# Patient Record
Sex: Female | Born: 1965 | Race: Black or African American | Hispanic: No | Marital: Single | Smoking: Never smoker
Health system: Southern US, Community
[De-identification: ages and names within clinical notes are randomized; demographics above are authoritative.]

## PROBLEM LIST (undated history)

## (undated) DIAGNOSIS — I10 Essential (primary) hypertension: Secondary | ICD-10-CM

---

## 2007-10-04 ENCOUNTER — Emergency Department (HOSPITAL_COMMUNITY): Admission: EM | Admit: 2007-10-04 | Discharge: 2007-10-04 | Payer: Self-pay | Admitting: Emergency Medicine

## 2008-06-22 ENCOUNTER — Emergency Department (HOSPITAL_COMMUNITY): Admission: EM | Admit: 2008-06-22 | Discharge: 2008-06-22 | Payer: Self-pay | Admitting: Emergency Medicine

## 2011-07-23 ENCOUNTER — Inpatient Hospital Stay (INDEPENDENT_AMBULATORY_CARE_PROVIDER_SITE_OTHER)
Admission: RE | Admit: 2011-07-23 | Discharge: 2011-07-23 | Disposition: A | Discharge status: Home / Self Care | Payer: BC Managed Care – PPO | Source: Ambulatory Visit | Attending: Emergency Medicine | Admitting: Emergency Medicine

## 2011-07-23 ENCOUNTER — Encounter: Payer: Self-pay | Admitting: Emergency Medicine

## 2011-07-23 DIAGNOSIS — H1130 Conjunctival hemorrhage, unspecified eye: Secondary | ICD-10-CM | POA: Insufficient documentation

## 2011-08-16 LAB — CBC
HCT: 41.1
Hemoglobin: 14
MCHC: 34
MCV: 84
Platelets: 382
RBC: 4.9
RDW: 13.1
WBC: 11.5 — ABNORMAL HIGH

## 2011-08-16 LAB — BASIC METABOLIC PANEL
BUN: 11
CO2: 29
Calcium: 8.8
Chloride: 104
Creatinine, Ser: 0.92
GFR calc Af Amer: >60
GFR calc non Af Amer: >60
Glucose, Bld: 83
Potassium: 3.1 — ABNORMAL LOW
Sodium: 139

## 2011-08-16 LAB — POCT CARDIAC MARKERS
CKMB, poc: 1.3
Myoglobin, poc: 69.2
Troponin i, poc: <0.05

## 2011-10-15 ENCOUNTER — Encounter: Payer: Self-pay | Admitting: Family Medicine

## 2011-10-15 ENCOUNTER — Ambulatory Visit (INDEPENDENT_AMBULATORY_CARE_PROVIDER_SITE_OTHER): Payer: BC Managed Care – PPO | Admitting: Family Medicine

## 2011-10-15 VITALS — BP 148/86 | HR 77 | Ht 65.0 in | Wt 216.0 lb

## 2011-10-15 DIAGNOSIS — R030 Elevated blood-pressure reading, without diagnosis of hypertension: Secondary | ICD-10-CM

## 2011-10-15 DIAGNOSIS — M25469 Effusion, unspecified knee: Secondary | ICD-10-CM

## 2011-10-15 DIAGNOSIS — E669 Obesity, unspecified: Secondary | ICD-10-CM

## 2011-10-15 MED ORDER — PHENTERMINE HCL 37.5 MG PO CAPS: 37.5000 mg | ORAL_CAPSULE | ORAL | Status: DC

## 2011-10-15 NOTE — Patient Instructions (Signed)
Knee Problems, Questions and Answers Knee problems are common in young people and adults. This document contains general information about several knee problems. It includes:  Descriptions of the different parts of the knee.   Diagram of the different parts of the knee.  Individual sections describe specific types of knee injuries and their:   Symptoms.   Diagnosis.   Treatment.  Information on how to prevent these problems is also provided. WHAT DO THE KNEES DO? HOW DO THEY WORK? The knees provide stable support for the body. Knees allow the legs to bend and straighten. Flexibility and stability are needed for standing and for motions like:  Walking.   Jumping.   Running.   Turning.   Crouching.  Supporting and moving parts help the knees do their job, these parts include:  Bones.   Cartilage.   Muscles.   Ligaments.   Tendons.  Any of these parts can be involved in knee pain or a knee not working right (dysfunction). WHAT CAUSES KNEE PROBLEMS? There are two general kinds of knee problems: mechanical and inflammatory. Mechanical Knee Problems are problems that result from:  Injury, such as a direct blow or sudden movements that strain the knee beyond its normal range of movement.   Overuse, repetitive motions that produce partial fiber failure in tendon or ligaments.   Osteoarthritis in the knee, result from wear and tear on its parts.  Inflammatory Knee Problems are inflammation that occurs in certain rheumatic diseases, such as:   Rheumatoid arthritis.   Systemic lupus erythematosus.  JOINT BASICS  The point at which two or more bones are connected is called a joint.   In all joints, the bones are kept from grinding against each other by the tissue lining the ends of the bones called cartilage.   Bones are joined to bones by strong, elastic bands of tissue called ligaments.   Tendons are tough cords of tissue that connect muscle to bone.   Muscles work  in opposing pairs to bend and straighten joints. While muscles are not technically part of a joint, they are important because strong muscles help support and protect joints.  WHAT ARE THE PARTS OF THE KNEE? Like any joint, the knee is composed of bones and cartilage, ligaments, tendons, and muscles. BONES AND CARTILAGE The knee joint is the junction of four bones:   The thigh bone or upper leg bone (femur).   The shin bone or larger bone of the lower leg (tibia).   The small bone on the outside of the knee where ligaments attach (fibula).   The knee cap (patella). The patella is 2 to 3 inches wide and 3 to 4 inches long. It sits over the other bones at the front of the knee joint and slides when the leg moves. It protects the knee and gives leverage to muscles.  The ends of the bones in the knee joint are covered with articular cartilage, a tough, elastic material that helps absorb shock and allows the knee joint to move smoothly. Separating the bones of the knee are pads of connective tissue which are called meniscus. The plural is menisci. The menisci are divided into two crescent-shaped discs positioned between the tibia and femur on the outer and inner sides of each knee. The two menisci in each knee act as shock absorbers, cushioning the lower part of the leg from the weight of the rest of the body as well as enhancing stability. MUSCLES There are two groups of  muscles at the knee.  The quadriceps muscle are four muscles on the front of the thigh that work to straighten the leg from a bent position.   The hamstring muscles, which bend the leg at the knee, run along the back of the thigh from the hip to just below the knee.  Keeping these muscles strong with exercises such as walking up stairs or riding a stationary bicycle helps support and protect the knee. TENDONS AND LIGAMENTS  The quadriceps tendon connects the quadriceps muscle to the patella and provides the power to extend the  leg. The patella is a bone within this tendon. Four ligaments connect the femur and tibia and give the joint strength and stability:   The medial collateral ligament (MCL) provides stability to the inner (medial) part of the knee.   The lateral collateral ligament (LCL) provides stability to the outer (lateral) part of the knee.   The anterior cruciate ligament (ACL), in the center of the knee, limits rotation and the forward movement of the tibia.   The posterior cruciate ligament (PCL), also in the center of the knee, limits backward movement of the tibia.   Other ligaments are part of the knee capsule. This is the protective, fiber-like structure that wraps around the knee joint. Inside the capsule, the joint is lined with a thin, soft tissue called synovium. This tissue produces the fluid (synovial fluid) which lubricates the joint.  HOW ARE KNEE PROBLEMS DIAGNOSED? Caregivers use several methods to diagnose knee problems:  Medical history--The patient tells the caregiver details about:   Symptoms.   Injuries.   Medical conditions.   Physical examination-- To help the caregiver understand how the knee is working, the patient may be asked to stand, walk or squat. The caregiver, to discover the limits of movement and the location of pain in the knee, may:   Bend the knee.   Straighten the knee.   Rotate (turn) turn the knee.   Press on the knee to feel for injury.   Diagnostic tests--The caregiver uses one or more stress tests to determine the nature of a knee problem.   X-ray (radiography)--An x-ray beam is passed through the knee to produce a two-dimensional picture of the bones.   Computerized axial tomography (CAT) scan--X-rays are passed through the knee at different angles, detected by a scanner, and analyzed by a computer. This produces a series of clear cross-sectional images ("slices") of the knee tissues on a computer screen. CAT scan images show details of bone  structure, show soft tissues such as ligaments or muscles to a limited degree, can give a three-dimensional view of the knee.   Bone scan (radionuclide scanning)--A very small amount of radioactive material is injected into the patient's bloodstream and detected by a scanner. This test detects blood flow to the bone and cell activity within the bone and can show abnormalities. This may help the caregiver understand what is wrong.   Magnetic resonance imaging (MRI)--Energy from a powerful magnet (rather than x-rays) stimulates knee tissue to produce signals. These signals are detected by a scanner and analyzed by a computer. Like a CAT scan, a computer is used to produce three-dimensional views of the knee during MRI. The MRI provides precise details of ligament, tendon and cartilage structure.   Arthroscopy--The surgeon manipulates a small, lighted optic tube (arthroscope) that has been inserted into the joint through a small incision in the knee. Images of the inside of the knee joint are projected onto a  television screen. While the arthroscope is inside the knee joint, removal of loose pieces of bone or cartilage, or the repair of torn ligaments and menisci can be preformed.   Biopsy--The caregiver removes tissue to examine under a microscope.   Aspiration of fluid from the knee--The laboratory will analyze the fluid for cell count, presence of crystals that produce inflammation (as in gout where Uric Acid crystals are the cause of the inflammation) and check for infection.  WHAT IS ARTHRITIS OF THE KNEE?   Arthritis of the knee is most often osteoarthritis. In this disease, the cartilage in the joint gradually wears away. It may be caused by excess stress on the joint from:   Trauma.   Deformity.   Repeated injury.   Excess weight.   It most often affects middle-aged and older people. A young person who develops osteoarthritis may have an inherited form of the disease or may have  experienced continuous irritation from an unrepaired knee injury or other injury.   In rheumatoid arthritis, which can also affect the knees, the joint becomes inflamed and cartilage may be destroyed. Rheumatoid arthritis often affects people at an earlier age than osteoarthritis and often involves multiple joints.   Arthritis can also affect supporting structures such as muscles, tendons, and ligaments.  WHAT ARE SIGNS OF ARTHRITIS OF THE KNEE?  Someone who has arthritis of the knee may experience:   Pain.   Swelling/ fluid on the knee.   A decrease in knee motion.   A common symptom is morning stiffness. This generally improves as the person moves around.   Sometimes the joint locks or clicks. These signs may occur in other knee disorders as well.   The caregiver may confirm the diagnosis by:   Performing a physical examination.   Examining x-rays, which typically show a loss of joint space.   Blood tests may be helpful for diagnosing rheumatoid arthritis, but other tests may be needed.   Analyzing fluid from the knee joint may be helpful in diagnosing some kinds of arthritis.   The caregiver may use arthroscopy to directly see damage to cartilage, tendons, and ligaments and to confirm a diagnosis. Arthroscopy is usually done only if a repair procedure is to be performed.  WHAT IS TREATMENT FOR ARTHRITIS OF THE KNEE?  Most often osteoarthritis of the knee is treated with pain-reducing medicines, such as:   Nonsteroidal anti-inflammatory drugs (NSAID's)   Exercises to restore joint movement and strengthen the knee.   Losing excess weight can also help people with osteoarthritis.   Rheumatoid arthritis of the knee may require physical therapy and more powerful medicines. In people with severe arthritis of the knee, a seriously damaged joint may need to be replaced with an artificial one.  WHAT IS CHONDROMALACIA?  Chondromalacia refers to softening of the articular cartilage  of the knee cap. This disorder occurs most often in young adults. Instead of gliding smoothly across the lower end of the thigh bone, the knee cap rubs against it, thereby roughening the cartilage underneath the knee cap. The damage may range from a slightly abnormal surface of the cartilage to a surface that has been worn away to the bone. It can be caused by:   Injury.   Overuse.   Misalignment of the patellar tendon.   Muscle weakness (generally the quadriceps).   Chondromalacia related to injury occurs when a blow to the knee cap tears off either a small piece of cartilage or a large fragment containing  a piece of bone.  WHAT ARE SYMPTOMS OF CHONDROMALACIA?  The most frequent symptom is a dull pain around or under the knee cap. This pain worsens when walking down stairs, or hills or sitting with the knee bent for long periods of time.   A person may also feel pain when climbing stairs or when the knee bears weight as it straightens.   The disorder is common in:   Runners.   Skiers.   Cyclists.   Soccer players.   A patient's description of symptoms, the physical exam, and a follow-up x-ray usually help the caregiver make a diagnosis.   Although arthroscopy can confirm the diagnosis. It is not used unless the condition requires extensive treatment.  WHAT IS TREATMENT FOR CHONDROMALACIA?  Many caregivers recommend that patients with chondromalacia perform low-impact exercises. The knee must not bend more than 90 degrees. This includes:   Swimming.   Riding a stationary bicycle.   Using a cross-country ski machine.   Electrical stimulation may also be used to strengthen the muscles.   If these treatments do not improve the condition, the caregiver may perform arthroscopic. Goals of this surgery include smoothing the surface of the cartilage and "washing out" the cartilage fragments that cause the joint to catch during bending and straightening.   In more severe cases,  surgery may be necessary to:   Correct the alignment of the knee cap.   Decrease the pressure on the undersurface of the patella.   Relieve friction with the cartilage.   Reposition parts that are out of alignment.  WHAT CAUSES INJURIES TO THE MENISCUS? The meniscus is easily injured by the force of rotating the knee while bearing weight. A partial or total tear may occur when a person quickly twists or rotates the upper leg while the foot stays still. For example, when dribbling a basketball around an opponent or turning to hit a tennis ball. If the tear is tiny, the meniscus stays connected to the front and back of the knee. If the tear is large, the meniscus may be left in an abnormally mobile position which produces instability. The seriousness of a tear depends on its location and extent. WHAT ARE SYMPTOMS OF INJURIES TO THE MENISCUS?  Pain, particularly when the knee is straightened.   If the pain is mild, the patient may continue with normal activity.   Severe pain may occur if a fragment of the meniscus catches between the femur and the tibia.   Swelling may occur soon after injury if blood vessels are disrupted. Swelling may occur several hours later if the joint fills with fluid produced by the joint lining (synovium) as a result of inflammation. If the synovium is injured, it may become inflamed and produce fluid. This makes the knee swell.   Sometimes, an injury that occurred in the past but was not treated becomes painful months or years later.   After any injury, the knee may click, lock, feel weak, or give way without warning.   Although symptoms of meniscal injury may disappear on their own (particularly with a stable meniscal tear), they frequently persist or return and require treatment.  HOW IS INJURY TO THE MENISCUS DIAGNOSED?  The caregiver will listen to the patient's description of the pain and swelling. The caregiver will perform a physical examination and take  x-rays of the knee. The examination may include a test in which the caregiver bends the leg, and then rotates the leg outward and inward while extending  it. Pain along the joint line or an audible click suggests a meniscal tear.   An MRI may be done.   Occasionally, the caregiver may use arthroscopy without obtaining the MRI to diagnose and treat a meniscal tear.  WHAT IS TREATMENT FOR INJURY TO THE MENISCUS?  The caregiver may recommend a muscle-strengthening program if:   The tear is minor.   The pain and symptoms are improving.   Exercises for meniscal problems are best started with guidance from a caregiver and physical therapist or athletic trainer. The therapist will make sure that the patient does the exercises properly and without risking new or repeat injury. The following exercises after injury to the meniscus are designed to build up the quadriceps and hamstring muscles and increase flexibility and strength.   Warming up the joint by riding a stationary bicycle, then straightening and raising the leg (but not straightening it too much).   Extending the leg while sitting (a weight may be worn on the ankle for this exercise).   Raising the leg while lying on the stomach.   Exercising in a pool (walking as fast as possible in chest-deep water, performing small flutter kicks while holding onto the side of the pool, and raising each leg to 90 in chest-deep water while pressing the back against the side of the pool).   If the tear is more extensive, the caregiver may perform arthroscopic with or without open surgery to see the extent of injury and to repair the tear. The caregiver can sew the meniscus back in place if the patient is relatively young, if the injury is in an area with a good blood supply, and if the ligaments are intact. Most young athletes are able to return to active sports after meniscus repair.   If the patient is elderly or the tear is in an area with a poor blood  supply, the caregiver may trim a small portion of the meniscus to even the surface. In rare cases, the caregiver removes the entire meniscus. Osteoarthritis is more likely to develop in the knee if the entire meniscus is removed.   Recovery after surgical repair takes several weeks to months. Activity after surgery is slightly more restricted than when the meniscus is partially removed. However, putting weight on the joint actually helps recovery. Regardless of the form of surgery, rehabilitation usually includes:   Walking.   Bending the legs.   Doing exercises that stretch and build up leg muscles.   The best results of treatment for meniscal injury are obtained in people who:   Do not have articular cartilage changes.   Have an intact ACL.  LIGAMENT INJURIES WHAT ARE THE CAUSES OF ANTERIOR AND POSTERIOR CRUCIATE LIGAMENT INJURIES?  Injury to the cruciate ligaments is sometimes referred to as a "sprain".   The ACL is most often stretched or torn (or both) by a sudden twisting or pushing the ACL beyond its normal range. For example, when the feet are planted one way and the knee rotates in the opposite direction.   The PCL is most often injured by a direct impact, such as in an automobile accident or football tackle.  WHAT ARE SYMPTOMS?  Injury to a cruciate ligament may not cause pain. Symptoms may include:   A popping sound   Buckling when trying to stand on the leg.   The caregiver will perform several physical exam tests. These tests are to see whether the parts of the knee stay in proper position  when pressure is applied in different directions.   A thorough examination is essential. An MRI is very accurate in detecting a complete tear. Arthroscopy may be the only reliable means of detecting a partial one.  TREATMENT  For an incomplete tear, the caregiver may recommend that the patient begin an exercise program to strengthen surrounding muscles.   The caregiver may also  prescribe a brace to protect the knee during activity.   For a completely torn ACL in an active athlete and motivated person, the caregiver is likely to recommend surgery. The surgeon may reconstruct the torn ligament by using:   A piece (graft) of healthy ligament from the patient (autograft)   A piece of ligament from a tissue bank (allograft). One of the most important elements in a patient's successful recovery after cruciate ligament surgery is a 4- to 61-month exercise program. This program may involve using special exercise equipment at a rehabilitation or sports center. Successful surgery special exercises will allow the patient to return to a normal, active lifestyle.  MEDIAL AND LATERAL COLLATERAL LIGAMENT INJURIES WHAT IS THE MOST COMMON CAUSE OF MEDIAL AND LATERAL COLLATERAL LIGAMENT INJURIES? The MCL is more commonly injured than the LCL. The cause is most often a blow to the outer side of the knee. This injury stretches and tears the ligament on the inner side of the knee. Such blows frequently occur in contact sports like football or hockey. SYMPTOMS AND DIAGNOSIS  When injury to the MCL occurs, a person may feel a pop and the knee may buckle sideways.   Pain and swelling are common.   A thorough exam is needed to determine the kind and extent of the injury.   To diagnose a collateral ligament injury, the caregiver exerts pressure on the side of the knee to determine the degree of pain and the looseness of the joint.   An MRI is helpful in diagnosing injuries to these ligaments.  TREATMENT  Most sprains of the collateral ligaments will heal if the patient follows a prescribed exercise program.   In addition to exercise, the caregiver may recommend ice packs to reduce pain and swelling and a small sleeve-type brace to protect and stabilize the knee.   A sprain may take 4 to 6 weeks to heal.   A patient with a severely sprained or torn collateral ligament may also have a torn  ACL. This usually requires surgical repair.  TENDON INJURIES AND DISORDERS WHAT CAUSES TENDINITIS AND RUPTURED TENDONS?  Knee tendon injuries range from tendinitis to a torn (ruptured) tendon.   If a person overuses a tendon during certain activities such as dancing, cycling, or running, the tendon stretches like a worn-out rubber band and becomes inflamed.   Also, trying to break a fall may cause the quadriceps muscles to contract and tear the quadriceps tendon above the patella or the patellar tendon below the patella. This type of injury is most likely to happen in older people.   Tendinitis of the patellar tendon is sometimes called jumper's knee because in sports that require jumping, such as basketball or volleyball, the muscle contraction and force of hitting the ground after a jump strain the tendon.   After repeated stress, the tendon may become inflamed or tear.  SYMPTOMS AND DIAGNOSIS  People with tendinitis often have tenderness at the point where the patellar tendon meets the bone. In addition, they may feel pain during running, fast walking, or jumping.   A complete rupture of the quadriceps  or patellar tendon is painful. It also makes it difficult for a person to bend, extend, or lift the leg against gravity.   If there is not much swelling, the caregiver may be able to feel a defect in the tendon near the tear during a physical examination.   An x-ray will show that the patella is lower than normal in a quadriceps tendon tear and higher than normal in a patellar tendon tear. The caregiver may use an MRI to confirm a partial or total tear.  TREATMENT  Initially, the caregiver may ask a patient with tendinitis to rest, elevate, and apply ice to the knee and to take medicines to relieve pain and decrease inflammation and swelling.   If the quadriceps or patellar tendon is completely ruptured, a surgeon will reattach the ends. After surgery, the patient will wear a cast or brace  for 3 to 6 weeks and use crutches.   For a partial tear, the caregiver might apply a cast or an extension knee brace without performing surgery.   Rehabilitating a partial or complete tear of a tendon requires an exercise program that is similar to but less forceful than that used for ligament injuries. The goals of exercise are to restore the ability to bend and straighten the knee and to strengthen the leg to prevent repeat injury. A rehabilitation program may last 4 to 6 months. A patient can return to many activities before then.  OSGOOD-SCHLATTER DISEASE WHAT CAUSES OSGOOD-SCHLATTER DISEASE?  Osgood-Schlatter disease is caused by repetitive stress or tension on part of the growth area of the upper tibia (the apophysis). Symptoms included inflammation of the patellar tendon and surrounding soft tissues at the point where the tendon attaches to the tibia.   The disease may also be associated with an injury in which the tendon is stretched so much that it tears away from the tibia and takes a fragment of bone with it.   The disease generally affects active young people. Particularly boys between the ages of 89 and 22, who play games or sports that include frequent running and jumping and who have open growth plates.  SYMPTOMS AND DIAGNOSIS  People with this disease experience pain just below the knee joint. This pain usually worsens with activity and is relieved by rest.   The bony bump that is particularly painful when pressed may increase in size at the upper edge of the tibia (below the knee cap).   Usually motion of the knee is not affected.   Pain may last a few months and may come back with periods of high activity until the child's growth is completed.   Osgood-Schlatter disease is most often diagnosed by the symptoms and the physical exam. An x-ray may be normal, or show an injury. An x-ray, more typically, will show that the growth area is fragmented.  TREATMENT  Usually, the  disease goes away without aggressive treatment.   Applying ice to the knee when pain begins helps relieve inflammation. Applying ice is sometimes used along with stretching and strengthening exercises.   The caregiver may advise the patient to limit participation in vigorous sports. Children who wish to continue less stressful sports activities may need to wear knee pads for protection and apply ice to the knee after activity. If there is a great deal of pain, sports activities may be limited until discomfort becomes tolerable.  ILIOTIBIAL BAND SYNDROME WHAT CAUSES ILIOTIBIAL BAND SYNDROME? This is an overuse condition in which inflammation results when  a band of a tendon rubs over the outer bone of the knee. Although iliotibial band syndrome may be caused by direct injury to the knee, it is most often caused by the stress of long-term overuse. SYMPTOMS AND DIAGNOSIS  A person with this syndrome feels an ache or burning sensation at the outside of the knee during activity. Pain may be localized at the outside of the knee or radiate up the side of the thigh.   A person may also feel a snap when the knee is bent and then straightened.   Swelling may be absent and knee motion is normal.   The diagnosis of this disorder is typically based on the symptoms. Symptoms include pain at the outer side of the knee. Other problems with similar symptoms must also be excluded.  TREATMENT  Usually, the problem disappears if the person reduces activity and performs stretching exercises followed by muscle-strengthening exercises.   In rare cases when the syndrome does not disappear, surgery may be necessary to split the tendon so it is not stretched too tightly over the bone.  OTHER KNEE INJURIES WHAT IS OSTEOCHONDRITIS DISSECANS?  Osteochondritis dissecans results from a loss of the blood supply to an area of bone at the joint surface and usually involves the knee. The affected bone and its covering of  cartilage gradually loosen and cause pain.   This problem usually arises in an active adolescent or young adult. It may be due to a slight blockage of a small artery or to an unrecognized injury or tiny fracture that damages the overlying cartilage.   Lack of a blood supply can cause bone to break down (avascular necrosis).   The involvement of several joints or the appearance of the condition in several family members may indicate that the disorder is inherited.   A person with this condition may eventually develop osteoarthritis.  SYMPTOMS AND DIAGNOSIS  If normal healing does not occur, cartilage separates from the diseased bone and a fragment breaks loose into the knee joint. This causes weakness, sharp pain, and locking of the joint.   An x-ray, MRI, or arthroscopy can determine the condition of the cartilage and can be used to diagnose osteochondritis dissecans.  TREATMENT  If cartilage fragments have not broken loose, a surgeon may fix the cartilage and underlying bone in place with pins or screws. These pins or screws are sunk into the cartilage to stimulate a new blood supply.   If fragments are loose, the surgeon may scrape down the cavity to reach fresh bone and add a bone graft and fix the fragments in position. Fragments that cannot be mended are removed, and the cavity is drilled or scraped to stimulate new cartilage growth.   Research is being done to assess the use of cartilage cell implants and other tissue transplants to treat this disorder.  WHAT IS PLICA SYNDROME?  Plica syndrome occurs when plicae (bands of synovial tissue) are irritated by overuse or injury.   Synovial plicae are the remains of tissue pouches found in the early stages of fetal development. As the fetus develops, these pouches normally combine to form one large synovial cavity. If this process is incomplete, plicae remain as folds or bands of synovial tissue within the knee.   Injury, chronic overuse,  or inflammatory conditions are associated with this syndrome.  SYMPTOMS AND DIAGNOSIS  People with this syndrome are likely to experience pain and swelling, a clicking sensation, and locking and weakness of the knee.  Because the symptoms are similar to those of some other knee problems, plica syndrome is often misdiagnosed. Diagnosis usually depends on excluding other conditions that cause similar symptoms.  TREATMENT  The goal of treatment is to reduce inflammation of the synovium and thickening of the plicae.   The caregiver usually prescribes medicine to reduce inflammation.   The patient is also advised to reduce activity, apply ice and an elastic bandage to the knee, and do strengthening exercises.   A cortisone injection into the plica folds helps about half of those treated.   If treatment fails to relieve symptoms within 3 months, the caregiver may recommend arthroscopic or open surgery to remove the plicae.  WHAT KINDS OF CAREGIVERS TREAT KNEE PROBLEMS?  Extensive injuries and diseases of the knees are usually treated by an orthopedic surgeon, a doctor who has been trained in the nonsurgical and surgical treatment of bones, joints, and soft tissues such as ligaments, tendons, and muscles.   Patients seeking nonsurgical treatment of arthritis of the knee may also consult a rheumatologist. This is a caregiver specializing in the diagnosis and treatment of arthritis and related disorders.  HOW CAN PEOPLE PREVENT KNEE PROBLEMS? Some knee problems cannot be foreseen or prevented. However, a person can prevent many knee problems by following these suggestions:  Before exercising or playing sports, warm up by walking or riding a stationary bicycle, and then do stretches. Stretching the muscles in the front of the thigh (quadriceps) and back of the thigh (hamstrings) reduces tension on the tendons. Stretching also relieves pressure on the knee during activity.   Strengthen the leg  muscles by doing specific exercises (for example, by walking up stairs or hills, or by riding a stationary bicycle). A supervised workout with weights is another way to strengthen the leg muscles that support the knee.   Avoid sudden changes in the intensity of exercise. Increase the force or duration of activity gradually.   Wear shoes that both fit properly and are in good condition. Good shoes will help maintain balance and leg alignment when walking or running. Knee problems can be caused by flat feet or feet that roll inward. People can often reduce some of these problems by wearing special shoe inserts (orthotics).   Maintain a healthy weight to reduce stress on the knee. Obesity increases the risk of degenerative (wearing) conditions such as osteoarthritis of the knee.  WHAT TYPES OF EXERCISE ARE MOST SUITABLE FOR SOMEONE WITH KNEE PROBLEMS? Three types of exercise are best for people with arthritis:  Range-of-motion exercises help maintain normal joint movement and relieve stiffness. This type of exercise helps maintain or increase flexibility.   Strengthening exercises help maintain or increase muscle strength. Strong muscles help support and protect joints affected by arthritis.   Aerobic or endurance exercises improve function of the heart and circulation and help control weight. Weight control can be important to people who have arthritis because extra weight puts pressure on many joints. Some studies show that aerobic exercise can reduce inflammation in some joints.  WHERE CAN PEOPLE FIND MORE INFORMATION ABOUT KNEE PROBLEMS? [employer] Arthritis and Musculoskeletal and Skin: www.niams.SelfMillionaire.nl American Academy of Orthopedic Surgeons: www.aaos.org [employer] of Rheumatology: www.rheumatology.org American Physical Therapy Association: www.apta.org Arthritis Foundation: www.arthritis.org Document Released: 11/07/2003 Document Revised: 07/17/2011 Document Reviewed:  02/22/2009 [facility] Patient Information 2012 [place], [place].Obesity Obesity is defined as having a body mass index (BMI) of 30 or more. To calculate your BMI divide your weight in pounds by your  height in inches squared and multiply that product by 703. Major illnesses resulting from long-term obesity include:  Stroke.   Heart disease.   Diabetes.   Many cancers.   Arthritis.  Obesity also complicates recovery from many other medical problems.  CAUSES   A history of obesity in your parents.   Thyroid hormone imbalance.   Environmental factors such as excess calorie intake and physical inactivity.  TREATMENT  A healthy weight loss program includes:  A calorie restricted diet based on individual calorie needs.   Increased physical activity (exercise).  An exercise program is just as important as the right low-calorie diet.  Weight-loss medicines should be used only under the supervision of your physician. These medicines help, but only if they are used with diet and exercise programs. Medicines can have side effects including nervousness, nausea, abdominal pain, diarrhea, headache, drowsiness, and depression.  An unhealthy weight loss program includes:  Fasting.   Fad diets.   Supplements and drugs.  These choices do not succeed in long-term weight control.  HOME CARE INSTRUCTIONS  To help you make the needed dietary changes:   Exercise and perform physical activity as directed by your caregiver.   Keep a daily record of everything you eat. There are many free websites to help you with this. It may be helpful to measure your foods so you can determine if you are eating the correct portion sizes.   Use low-calorie cookbooks or take special cooking classes.   Avoid alcohol. Drink more water and drinks with no calories.   Take vitamins and supplements only as recommended by your caregiver.   Weight loss support groups, Registered Dieticians, counselors, and stress  reduction education can also be very helpful.  Document Released: 12/12/2004 Document Revised: 07/17/2011 Document Reviewed: 10/11/2007 [facility] Patient Information 2012 [place], [place].

## 2011-10-15 NOTE — Progress Notes (Signed)
Subjective:    Patient ID: Tammy Reed, female    DOB: 22-Jan-1966, 45 y.o.   MRN: [id number]  HPI #1 Patient is here because of obesity she reports that when she was taking phentermine she lost a lot of weight and lost 30 pounds over several months/year she stopped taking the phentermine one on a meat free diet or she's a meat free fast and reported eating as much asshe wanted" and she gained about 20 pounds back. She was go back on phentermine on questioning she reports not exercising a regular basis and she is to contact me. Stressed to her that if we do start the phentermine she definitely is going to need to exercise. #2 swelling of the right knee she reports going to take a fluid pill she has some swelling of the right knee this is a room for a couple of months she felt that because she's gained the weight back that the right knee got  fat that the left he didn't have any changes. #3 elevated blood pressure blood pressure is elevated she denies history of hypertension will be continued on  Review of Systems  Constitutional: Positive for appetite change and fatigue. Negative for activity change.  Musculoskeletal: Positive for myalgias, joint swelling and arthralgias. Negative for gait problem.  Psychiatric/Behavioral: Negative for self-injury.  All other systems reviewed and are negative.      BP 148/86  Pulse 77  Ht 5\' 5"  (1.651 m)  Wt 216 lb (97.977 kg)  BMI 35.94 kg/m2  SpO2 97%  Objective:   Physical Exam  Vitals reviewed. Constitutional: She is oriented to person, place, and time. She appears well-developed and well-nourished.  HENT:  Head: Normocephalic.  Neck: Normal range of motion. Neck supple.  Cardiovascular: Normal rate, regular rhythm and normal heart sounds.   Pulmonary/Chest: Effort normal and breath sounds normal. No respiratory distress.  Musculoskeletal: She exhibits tenderness.       Right knee: She exhibits swelling and deformity. She exhibits normal range of  motion, no effusion, no erythema, no LCL laxity and normal patellar mobility. tenderness found. Lateral joint line tenderness noted.       Over the R inferior and lateral knee there is a swellling that seems to be more like a bursa No generalized edema was noted  Neurological: She is alert and oriented to person, place, and time. She has normal reflexes.  Skin: Skin is warm.  Psychiatric: She has a normal mood and affect. Her behavior is normal.          Assessment & Plan:  #1 obesity we'll place her back on phentermine with extended relation to she's on lose weight each month she is going to exercise her medical basis initially come in monthly to verify the phentermine is working as not to stop it in the next month she comes in for physical if she's not had actually have a complete physical and quite to her her last Pap smear was been a while. Explained to the we will also lab work and EKG when she's here to use the phentermine long-term #2 we'll follow up elevated blood pressure when she returns #3 right knee swelling explained to her that this could be a bursa assist not inflamed a cousin. Be truly a joint effusion this is a well little local localized. Can also be a fat pad I asked her to watch the swelling and if it starts growing will need to send her to an  orthopedic for possible aspiration  with further evaluation. He offers x-ray of the right knee she says it has been done before and she declines

## 2011-10-21 NOTE — Progress Notes (Signed)
Summary: LT EYE PAIN AND RED...WSE rm 2   Vital Signs:  Patient Profile:   45 Years Old Female CC:      LT eye redness x today Weight:      204.50 pounds O2 Sat:      98 % O2 treatment:    Room Air Temp:     98.8 degrees F oral Pulse rate:   75 / minute Resp:     75 per minute BP sitting:   172 / 100  (left arm) Cuff size:   regular  Vitals Entered By: Clemens Catholic LPN (July 23, 2011 12:18 PM)                  Updated Prior Medication List: No Medications Current Allergies: No known allergies History of Present Illness Chief Complaint: LT eye redness x today History of Present Illness: L eye redness since today.  No pain, blurry vision, itching, irritation.  She doesn't recall any trauma.  No URI symptoms, F/C/N/V.  REVIEW OF SYSTEMS Constitutional Symptoms      Denies fever, chills, night sweats, weight loss, weight gain, and fatigue.  Eyes       Complains of eye pain.      Denies change in vision, eye discharge, glasses, contact lenses, and eye surgery. Ear/Nose/Throat/Mouth       Denies hearing loss/aids, change in hearing, ear pain, ear discharge, dizziness, frequent runny nose, frequent nose bleeds, sinus problems, sore throat, hoarseness, and tooth pain or bleeding.  Respiratory       Denies dry cough, productive cough, wheezing, shortness of breath, asthma, bronchitis, and emphysema/COPD.  Cardiovascular       Denies murmurs, chest pain, and tires easily with exhertion.    Gastrointestinal       Denies stomach pain, nausea/vomiting, diarrhea, constipation, blood in bowel movements, and indigestion. Genitourniary       Denies painful urination, kidney stones, and loss of urinary control. Neurological       Denies paralysis, seizures, and fainting/blackouts. Musculoskeletal       Denies muscle pain, joint pain, joint stiffness, decreased range of motion, redness, swelling, muscle weakness, and gout.  Skin       Denies bruising, unusual mles/lumps or  sores, and hair/skin or nail changes.  Psych       Denies mood changes, temper/anger issues, anxiety/stress, speech problems, depression, and sleep problems. Other Comments: pt c/i LT eye redness x today. no pain, no itching, no eye gtts.    Past History:  Past Medical History: Unremarkable  Past Surgical History: Denies surgical history  Family History: Family History Hypertension-mom  Social History: Never Smoked Alcohol use-no Drug use-no Smoking Status:  never Drug Use:  no Physical Exam General appearance: well developed, well nourished, no acute distress MSE: oriented to time, place, and person L eye: acute subconjunctival hemorrhage from 3 to 6 o'clock.  Otherwise eye exam is normal. Assessment New Problems: SUBCONJUNCTIVAL HEMORRHAGE (ICD-372.72)   Plan New Orders: New Patient Level II [99202] Planning Comments:   Reassurance given.  Follow up with eye doctor if any symptoms such as blurry vision or pain.  May be due to her HTN, so I'd like her to follow up with her PCP this week to discuss HTN and likely needs meds.  ER precautions for CP, SOB, HA, confusion, blurry vision.   The patient and/or caregiver has been counseled thoroughly with regard to medications prescribed including dosage, schedule, interactions, rationale for use, and possible side  effects and they verbalize understanding.  Diagnoses and expected course of recovery discussed and will return if not improved as expected or if the condition worsens. Patient and/or caregiver verbalized understanding.   Orders Added: 1)  New Patient Level II [99202]

## 2011-11-06 ENCOUNTER — Encounter: Payer: Self-pay | Admitting: Family Medicine

## 2011-11-07 ENCOUNTER — Ambulatory Visit: Payer: BC Managed Care – PPO | Admitting: Family Medicine

## 2011-11-21 ENCOUNTER — Ambulatory Visit: Payer: BC Managed Care – PPO | Admitting: Family Medicine

## 2011-11-26 ENCOUNTER — Encounter: Payer: Self-pay | Admitting: Family Medicine

## 2011-11-26 ENCOUNTER — Ambulatory Visit: Payer: BC Managed Care – PPO | Admitting: Family Medicine

## 2011-11-26 ENCOUNTER — Ambulatory Visit (INDEPENDENT_AMBULATORY_CARE_PROVIDER_SITE_OTHER): Payer: BC Managed Care – PPO | Admitting: Family Medicine

## 2011-11-26 DIAGNOSIS — I10 Essential (primary) hypertension: Secondary | ICD-10-CM

## 2011-11-26 DIAGNOSIS — E669 Obesity, unspecified: Secondary | ICD-10-CM

## 2011-11-26 MED ORDER — TRIAMTERENE-HCTZ 37.5-25 MG PO CAPS: 1.0000 | ORAL_CAPSULE | Freq: Every day | ORAL | Status: DC

## 2011-11-26 MED ORDER — PHENTERMINE HCL 37.5 MG PO CAPS: 37.5000 mg | ORAL_CAPSULE | ORAL | Status: DC

## 2011-11-26 NOTE — Patient Instructions (Signed)

## 2011-11-26 NOTE — Progress Notes (Signed)
Subjective:     Patient ID: Tammy Reed, female   DOB: [birth year], 46 y.o.   MRN: [id number]  HPI #1Elevated blood pressure. She is not on any blood pressure medications at this time this is the third visit here where blood pressures been elevated #2 obesity she was supposed to come in for a complete physical. Somehow that got derailed.  Review of Systems Essentially negative   BP 149/91  Pulse 91  Ht 5\' 5"  (1.651 m)  Wt 210 lb (95.255 kg)  BMI 34.95 kg/m2  SpO2 98% Objective:   Physical Exam WNWD BF  Lung clear , CVS S1 and S2  Roughly 6 lb weight loss    Assessment:     #1 hypertension #2 obesity    Plan:     #1 Place on Dyazide one capsule a day #2 renew renew her phentermine and sisters comes in for physical next month and warn her that we would not continue the phentermine if she has not had a complete physical next month

## 2011-12-31 ENCOUNTER — Encounter: Payer: BC Managed Care – PPO | Admitting: Family Medicine

## 2011-12-31 DIAGNOSIS — Z0289 Encounter for other administrative examinations: Secondary | ICD-10-CM

## 2012-06-02 ENCOUNTER — Encounter: Payer: Self-pay | Admitting: Family Medicine

## 2012-06-02 ENCOUNTER — Other Ambulatory Visit (HOSPITAL_COMMUNITY)
Admission: RE | Admit: 2012-06-02 | Discharge: 2012-06-02 | Disposition: A | Discharge status: Home / Self Care | Payer: BC Managed Care – PPO | Source: Ambulatory Visit | Attending: Family Medicine | Admitting: Family Medicine

## 2012-06-02 ENCOUNTER — Other Ambulatory Visit: Payer: Self-pay | Admitting: Family Medicine

## 2012-06-02 ENCOUNTER — Ambulatory Visit (INDEPENDENT_AMBULATORY_CARE_PROVIDER_SITE_OTHER): Payer: BC Managed Care – PPO | Admitting: Family Medicine

## 2012-06-02 VITALS — BP 139/88 | HR 74 | Ht 64.0 in | Wt 212.0 lb

## 2012-06-02 DIAGNOSIS — Z01419 Encounter for gynecological examination (general) (routine) without abnormal findings: Secondary | ICD-10-CM | POA: Insufficient documentation

## 2012-06-02 DIAGNOSIS — Z0000 Encounter for general adult medical examination without abnormal findings: Secondary | ICD-10-CM

## 2012-06-02 DIAGNOSIS — Z1211 Encounter for screening for malignant neoplasm of colon: Secondary | ICD-10-CM

## 2012-06-02 DIAGNOSIS — Z136 Encounter for screening for cardiovascular disorders: Secondary | ICD-10-CM

## 2012-06-02 DIAGNOSIS — E669 Obesity, unspecified: Secondary | ICD-10-CM

## 2012-06-02 LAB — HEMOCCULT GUIAC POC 1CARD (OFFICE): Fecal Occult Blood, POC: NEGATIVE

## 2012-06-02 LAB — POCT URINALYSIS DIPSTICK
Bilirubin, UA: NEGATIVE
Glucose, UA: NEGATIVE
Ketones, UA: NEGATIVE
Leukocytes, UA: NEGATIVE
Nitrite, UA: NEGATIVE
Protein, UA: NEGATIVE
Spec Grav, UA: 1.025
Urobilinogen, UA: 0.2
pH, UA: 6

## 2012-06-02 MED ORDER — PHENTERMINE HCL 37.5 MG PO CAPS: 37.5000 mg | ORAL_CAPSULE | ORAL | Status: DC

## 2012-06-02 NOTE — Progress Notes (Signed)
Subjective:    Patient ID: Tammy Reed, female    DOB: [birth year], 46 y.o.   MRN: [id number]  HPI  Patient is here for Pap smear and to start the phentermine.  Review of Systems  Constitutional: Positive for appetite change and unexpected weight change. Negative for activity change.  Cardiovascular: Positive for leg swelling.  Musculoskeletal:       Edema  No Known Allergies History   Social History  . Marital Status: Single    Spouse Name: N/A    Number of Children: N/A  . Years of Education: N/A   Occupational History  . Not on file.   Social History Main Topics  . Smoking status: Never Smoker   . Smokeless tobacco: Never Used  . Alcohol Use: No  . Drug Use: No  . Sexually Active: Yes    Birth Control/ Protection: IUD   Other Topics Concern  . Not on file   Social History Narrative  . No narrative on file   Family History  Problem Relation Age of Onset  . Heart disease Mother   . Depression Brother    No past medical history on file. No past surgical history on file.     BP 139/88  Pulse 74  Ht 5\' 4"  (1.626 m)  Wt 212 lb (96.163 kg)  BMI 36.39 kg/m2 Objective:   Physical Exam  Vitals reviewed. Constitutional: She is oriented to person, place, and time. She appears well-developed and well-nourished.  HENT:  Head: Normocephalic and atraumatic.  Right Ear: External ear normal.  Left Ear: External ear normal.  Nose: Nose normal.  Mouth/Throat: Oropharynx is clear and moist.  Eyes: Conjunctivae are normal. Pupils are equal, round, and reactive to light.  Fundoscopic exam:      The right eye shows no arteriolar narrowing, no AV nicking and no exudate.       The left eye shows no arteriolar narrowing, no AV nicking and no exudate.  Neck: Normal range of motion. Neck supple. No thyromegaly present.  Cardiovascular: Normal rate, regular rhythm, normal heart sounds and intact distal pulses.  Exam reveals no friction rub.   No murmur  heard. Pulmonary/Chest: Effort normal and breath sounds normal. No respiratory distress. She has no wheezes.  Abdominal: Soft. Bowel sounds are normal. She exhibits no distension. There is no tenderness.  Genitourinary: Rectum normal and uterus normal. Rectal exam shows no fissure, no tenderness and anal tone normal. Guaiac negative stool. No breast swelling, tenderness, discharge or bleeding. Pelvic exam was performed with patient prone. There is no rash, tenderness or lesion on the right labia. There is no rash, tenderness or lesion on the left labia. Uterus is not deviated and not enlarged. Cervix exhibits no motion tenderness. Right adnexum displays no mass, no tenderness and no fullness. Left adnexum displays no mass, no tenderness and no fullness. No erythema, tenderness or bleeding around the vagina. Vaginal discharge found.  Musculoskeletal: Normal range of motion. She exhibits no edema.  Neurological: She is alert and oriented to person, place, and time. She has normal reflexes. She displays normal reflexes. No cranial nerve deficit. She exhibits normal muscle tone. Coordination normal.  Skin: Skin is warm and dry. No rash noted. No erythema.  Psychiatric: She has a normal mood and affect. Her behavior is normal. Thought content normal.     EKG within normal parameters. Results for orders placed in visit on 06/02/12  POCT URINALYSIS DIPSTICK      Component Value Range  Color, UA yellow     Clarity, UA clear     Glucose, UA neg     Bilirubin, UA neg     Ketones, UA neg     Spec Grav, UA 1.025     Blood, UA trace     pH, UA 6.0     Protein, UA neg     Urobilinogen, UA 0.2     Nitrite, UA neg     Leukocytes, UA Negative    HEMOCCULT GUIAC POC 1CARD (OFFICE)      Component Value Range   Fecal Occult Blood, POC Negative     Card #1 Date 06/02/2012     Card #2 Fecal Occult Blod, POC       Card #2 Date       Card #3 Fecal Occult Blood, POC       Card #3 Date         Assessment  & Plan:   #1 health maintenance. Pap smear done. Will obtain routine labs. And also schedule her for mammogram #2 vaginal discharge. KOH and wet prep sent. #3 obesity. Start on phentermine return one month for followup. First month maybe in this visit with subsequent seen by physician in 4 months. #4 patient complained of having some edema and swelling of legs if labs are satisfactory we'll place her on Dyazide or Maxzide low dose 1 dose 3 times a week for swelling.

## 2012-06-02 NOTE — Patient Instructions (Signed)

## 2012-06-03 LAB — COMPREHENSIVE METABOLIC PANEL
ALT: 15 U/L (ref 0–35)
AST: 13 U/L (ref 0–37)
Albumin: 3.9 g/dL (ref 3.5–5.2)
Alkaline Phosphatase: 68 U/L (ref 39–117)
BUN: 19 mg/dL (ref 6–23)
CO2: 30 mEq/L (ref 19–32)
Calcium: 9 mg/dL (ref 8.4–10.5)
Chloride: 103 mEq/L (ref 96–112)
Creat: 0.93 mg/dL (ref 0.50–1.10)
Glucose, Bld: 89 mg/dL (ref 70–99)
Potassium: 3.6 mEq/L (ref 3.5–5.3)
Sodium: 139 mEq/L (ref 135–145)
Total Bilirubin: 0.5 mg/dL (ref 0.3–1.2)
Total Protein: 7.4 g/dL (ref 6.0–8.3)

## 2012-06-03 LAB — CBC WITH DIFFERENTIAL/PLATELET
Basophils Absolute: 0 10*3/uL (ref 0.0–0.1)
Basophils Relative: 0 % (ref 0–1)
Eosinophils Absolute: 0.2 10*3/uL (ref 0.0–0.7)
Eosinophils Relative: 2 % (ref 0–5)
HCT: 40.2 % (ref 36.0–46.0)
Hemoglobin: 13.4 g/dL (ref 12.0–15.0)
Lymphocytes Relative: 34 % (ref 12–46)
Lymphs Abs: 2.9 10*3/uL (ref 0.7–4.0)
MCH: 27.2 pg (ref 26.0–34.0)
MCHC: 33.3 g/dL (ref 30.0–36.0)
MCV: 81.5 fL (ref 78.0–100.0)
Monocytes Absolute: 0.4 10*3/uL (ref 0.1–1.0)
Monocytes Relative: 5 % (ref 3–12)
Neutro Abs: 5 10*3/uL (ref 1.7–7.7)
Neutrophils Relative %: 59 % (ref 43–77)
Platelets: 394 10*3/uL (ref 150–400)
RBC: 4.93 MIL/uL (ref 3.87–5.11)
RDW: 13.6 % (ref 11.5–15.5)
WBC: 8.5 10*3/uL (ref 4.0–10.5)

## 2012-06-03 LAB — LIPID PANEL
Cholesterol: 245 mg/dL — ABNORMAL HIGH (ref 0–200)
HDL: 67 mg/dL (ref >39)
LDL Cholesterol: 159 mg/dL — ABNORMAL HIGH (ref 0–99)
Total CHOL/HDL Ratio: 3.7 Ratio
Triglycerides: 93 mg/dL (ref <150)
VLDL: 19 mg/dL (ref 0–40)

## 2012-06-03 LAB — TSH: TSH: 1.992 u[IU]/mL (ref 0.350–4.500)

## 2012-06-03 LAB — WET PREP, GENITAL
Trich, Wet Prep: NONE SEEN
Yeast Wet Prep HPF POC: NONE SEEN

## 2012-06-03 LAB — HEMOGLOBIN A1C
Hgb A1c MFr Bld: 5.7 % — ABNORMAL HIGH (ref <5.7)
Mean Plasma Glucose: 117 mg/dL — ABNORMAL HIGH (ref <117)

## 2012-06-04 ENCOUNTER — Encounter: Payer: Self-pay | Admitting: Family Medicine

## 2012-06-09 ENCOUNTER — Telehealth: Payer: Self-pay | Admitting: *Deleted

## 2012-06-09 ENCOUNTER — Encounter: Payer: Self-pay | Admitting: *Deleted

## 2012-06-09 NOTE — Telephone Encounter (Signed)
Also she was to have Flagyl 500 mg one tablet twice a day for 10 days called in for her bacterial vaginosis discharge she had when she had her physical. That also needs to be called in as well as the Dyazide 1 capsule 3 times a week.

## 2012-06-09 NOTE — Telephone Encounter (Signed)
If she has gotten the letter I sent her I asked what pharmacy she wanted it sent to. That will be dyazide #15 1 po up to 3 x a week.

## 2012-06-09 NOTE — Telephone Encounter (Signed)
Pt calling asking about her blood work. states that you told her that you wouldn't prescribe her any fluid pills until you seen the blood work. Please advise.

## 2012-06-10 MED ORDER — METRONIDAZOLE 500 MG PO TABS: 500.0000 mg | ORAL_TABLET | Freq: Two times a day (BID) | ORAL | Status: AC

## 2012-06-10 MED ORDER — TRIAMTERENE-HCTZ 37.5-25 MG PO CAPS: ORAL_CAPSULE | ORAL | Status: DC

## 2012-07-07 ENCOUNTER — Ambulatory Visit: Payer: BC Managed Care – PPO | Admitting: Family Medicine

## 2012-07-07 DIAGNOSIS — Z0289 Encounter for other administrative examinations: Secondary | ICD-10-CM

## 2012-07-23 ENCOUNTER — Encounter: Payer: Self-pay | Admitting: Family Medicine

## 2012-07-23 ENCOUNTER — Ambulatory Visit (INDEPENDENT_AMBULATORY_CARE_PROVIDER_SITE_OTHER): Payer: BC Managed Care – PPO | Admitting: Family Medicine

## 2012-07-23 VITALS — BP 138/85 | HR 82 | Ht 64.0 in | Wt 207.0 lb

## 2012-07-23 DIAGNOSIS — R609 Edema, unspecified: Secondary | ICD-10-CM

## 2012-07-23 DIAGNOSIS — Z283 Underimmunization status: Secondary | ICD-10-CM

## 2012-07-23 DIAGNOSIS — Z2839 Other underimmunization status: Secondary | ICD-10-CM

## 2012-07-23 DIAGNOSIS — M25569 Pain in unspecified knee: Secondary | ICD-10-CM

## 2012-07-23 DIAGNOSIS — Z23 Encounter for immunization: Secondary | ICD-10-CM

## 2012-07-23 DIAGNOSIS — E669 Obesity, unspecified: Secondary | ICD-10-CM

## 2012-07-23 MED ORDER — TORSEMIDE 20 MG PO TABS: 20.0000 mg | ORAL_TABLET | ORAL | Status: DC

## 2012-07-23 MED ORDER — MELOXICAM 15 MG PO TABS: 15.0000 mg | ORAL_TABLET | Freq: Every day | ORAL | Status: DC

## 2012-07-23 MED ORDER — POTASSIUM CHLORIDE ER 10 MEQ PO TBCR: 20.0000 meq | EXTENDED_RELEASE_TABLET | ORAL | Status: DC

## 2012-07-23 MED ORDER — PHENTERMINE HCL 37.5 MG PO CAPS: 37.5000 mg | ORAL_CAPSULE | ORAL | Status: DC

## 2012-07-23 NOTE — Addendum Note (Signed)
Addended by: Ellsworth Lennox on: 07/23/2012 01:02 PM   Modules accepted: Orders

## 2012-07-23 NOTE — Progress Notes (Signed)
  Subjective:    Patient ID: Clydene Burack, female    DOB: [birth year], 46 y.o.   MRN: [id number]  Knee Pain  The incident occurred 3 to 5 days ago. The incident occurred at home. There was no injury mechanism. The pain is present in the left knee. The quality of the pain is described as stabbing. The pain is at a severity of 6/10. The pain is moderate. The pain has been intermittent since onset. Associated symptoms include an inability to bear weight and tingling. The symptoms are aggravated by movement and weight bearing. She has tried nothing for the symptoms.   #2 immunization deficiency. Patient doesn't know when her last tetanus shot was.   #3 obesity. She has been taking the phentermine. She says she exercises on the treadmill 3 times a week for 30 minutes. #4 vaginal discharge. She was treated with Flagyl and she states the discharge did clear up. #5 edema she still feels that she is having swelling in her hands and feet and that the Dyazide has not done much for that she even questions whether it's just a sugar pill.  Review of Systems  Constitutional: Positive for activity change and appetite change.  Genitourinary: Negative for vaginal discharge.  Musculoskeletal: Positive for joint swelling and arthralgias.  Neurological: Positive for tingling.      BP 138/85  Pulse 82  Ht 5\' 4"  (1.626 m)  Wt 207 lb (93.895 kg)  BMI 35.53 kg/m2  SpO2 99% Objective:   Physical Exam  Constitutional: She is oriented to person, place, and time. She appears well-developed and well-nourished.       Obese black female  HENT:  Head: Normocephalic and atraumatic.  Neck: Normal range of motion. Neck supple.  Cardiovascular: Normal rate, regular rhythm and normal heart sounds.   Pulmonary/Chest: Effort normal and breath sounds normal.  Musculoskeletal: Normal range of motion. She exhibits tenderness. She exhibits no edema.  Neurological: She is alert and oriented to person, place, and time.  Skin:  Skin is warm.  Psychiatric: She has a normal mood and affect. Her behavior is normal.      Assessment & Plan:    #1 knee pain. We'll place her on Mobic 15 mg one tablet a day since knee joint appears to be stable. Hopefully she will only have to use his for a week or 2. #2 obesity. We'll continue with phentermine she has lost about 5 pounds in the last 2 months. Return in a month for nurses visit and see me in 2 months. #3 edema. For the recurrent edema we are going to stop the Dyazide and place her on Demadex 20 mg one tablet 2 times week and to time she takes it she potassium pill of 20 mg as well. #4 vaginal discharge has resolved #5 immunization update she did not get tetanus shot earlier but will give her one today.

## 2012-07-23 NOTE — Patient Instructions (Signed)
Exercise to Lose Weight Exercise and a healthy diet may help you lose weight. Your doctor may suggest specific exercises. EXERCISE IDEAS AND TIPS  Choose low-cost things you enjoy doing, such as walking, bicycling, or exercising to workout videos.   Take stairs instead of the elevator.   Walk during your lunch break.   Park your car further away from work or school.   Go to a gym or an exercise class.   Start with 5 to 10 minutes of exercise each day. Build up to 30 minutes of exercise 4 to 6 days a week.   Wear shoes with good support and comfortable clothes.   Stretch before and after working out.   Work out until you breathe harder and your heart beats faster.   Drink extra water when you exercise.   Do not do so much that you hurt yourself, feel dizzy, or get very short of breath.  Exercises that burn about 150 calories:  Running 1  miles in 15 minutes.   Playing volleyball for 45 to 60 minutes.   Washing and waxing a car for 45 to 60 minutes.   Playing touch football for 45 minutes.   Walking 1  miles in 35 minutes.   Pushing a stroller 1  miles in 30 minutes.   Playing basketball for 30 minutes.   Raking leaves for 30 minutes.   Bicycling 5 miles in 30 minutes.   Walking 2 miles in 30 minutes.   Dancing for 30 minutes.   Shoveling snow for 15 minutes.   Swimming laps for 20 minutes.   Walking up stairs for 15 minutes.   Bicycling 4 miles in 15 minutes.   Gardening for 30 to 45 minutes.   Jumping rope for 15 minutes.   Washing windows or floors for 45 to 60 minutes.  Document Released: 12/07/2010 Document Revised: 10/24/2011 Document Reviewed: 12/07/2010 [employer] Patient Information 2012 [place], [place].Edema Edema is an abnormal build-up of fluids in tissues. Because this is partly dependent on gravity (water flows to the lowest place), it is more common in the leg sand thighs (lower extremities). It is also common in the looser tissues, like  around the eyes. Painless swelling of the feet and ankles is common and increases as a person ages. It may affect both legs and may include the calves or even thighs. When squeezed, the fluid may move out of the affected area and may leave a dent for a few moments. CAUSES   Prolonged standing or sitting in one place for extended periods of time. Movement helps pump tissue fluid into the veins, and absence of movement prevents this, resulting in edema.   Varicose veins. The valves in the veins do not work as well as they should. This causes fluid to leak into the tissues.   Fluid and salt overload.   Injury, burn, or surgery to the leg, ankle, or foot, may damage veins and allow fluid to leak out.   Sunburn damages vessels. Leaky vessels allow fluid to go out into the sunburned tissues.   Allergies (from insect bites or stings, medications or chemicals) cause swelling by allowing vessels to become leaky.   Protein in the blood helps keep fluid in your vessels. Low protein, as in malnutrition, allows fluid to leak out.   Hormonal changes, including pregnancy and menstruation, cause fluid retention. This fluid may leak out of vessels and cause edema.   Medications that cause fluid retention. Examples are sex hormones, blood pressure  medications, steroid treatment, or anti-depressants.   Some illnesses cause edema, especially heart failure, kidney disease, or liver disease.   Surgery that cuts veins or lymph nodes, such as surgery done for the heart or for breast cancer, may result in edema.  DIAGNOSIS  Your caregiver is usually easily able to determine what is causing your swelling (edema) by simply asking what is wrong (getting a history) and examining you (doing a physical). Sometimes x-rays, EKG (electrocardiogram or heart tracing), and blood work may be done to evaluate for underlying medical illness. TREATMENT  General treatment includes:  Leg elevation (or elevation of the affected  body part).   Restriction of fluid intake.   Prevention of fluid overload.   Compression of the affected body part. Compression with elastic bandages or support stockings squeezes the tissues, preventing fluid from entering and forcing it back into the blood vessels.   Diuretics (also called water pills or fluid pills) pull fluid out of your body in the form of increased urination. These are effective in reducing the swelling, but can have side effects and must be used only under your caregiver's supervision. Diuretics are appropriate only for some types of edema.  The specific treatment can be directed at any underlying causes discovered. Heart, liver, or kidney disease should be treated appropriately. HOME CARE INSTRUCTIONS   Elevate the legs (or affected body part) above the level of the heart, while lying down.   Avoid sitting or standing still for prolonged periods of time.   Avoid putting anything directly under the knees when lying down, and do not wear constricting clothing or garters on the upper legs.   Exercising the legs causes the fluid to work back into the veins and lymphatic channels. This may help the swelling go down.   The pressure applied by elastic bandages or support stockings can help reduce ankle swelling.   A low-salt diet may help reduce fluid retention and decrease the ankle swelling.   Take any medications exactly as prescribed.  SEEK MEDICAL CARE IF:  Your edema is not responding to recommended treatments. SEEK IMMEDIATE MEDICAL CARE IF:   You develop shortness of breath or chest pain.   You cannot breathe when you lay down; or if, while lying down, you have to get up and go to the window to get your breath.   You are having increasing swelling without relief from treatment.   You develop a fever over 102 F (38.9 C).   You develop pain or redness in the areas that are swollen.   Tell your caregiver right away if you have gained 3 lb/1.4 kg in 1  day or 5 lb/2.3 kg in a week.  MAKE SURE YOU:   Understand these instructions.   Will watch your condition.   Will get help right away if you are not doing well or get worse.  Document Released: 11/04/2005 Document Revised: 10/24/2011 Document Reviewed: 06/22/2008 [facility] Patient Information 2012 [place], [place].

## 2012-09-24 ENCOUNTER — Ambulatory Visit: Payer: BC Managed Care – PPO | Admitting: Family Medicine

## 2012-09-24 DIAGNOSIS — Z0289 Encounter for other administrative examinations: Secondary | ICD-10-CM

## 2012-10-21 ENCOUNTER — Encounter: Payer: Self-pay | Admitting: Family Medicine

## 2012-10-21 ENCOUNTER — Ambulatory Visit (INDEPENDENT_AMBULATORY_CARE_PROVIDER_SITE_OTHER): Payer: BC Managed Care – PPO | Admitting: Family Medicine

## 2012-10-21 VITALS — BP 144/82 | HR 71 | Ht 65.0 in | Wt 216.0 lb

## 2012-10-21 DIAGNOSIS — G43109 Migraine with aura, not intractable, without status migrainosus: Secondary | ICD-10-CM | POA: Insufficient documentation

## 2012-10-21 MED ORDER — SUMATRIPTAN SUCCINATE 50 MG PO TABS: 50.0000 mg | ORAL_TABLET | ORAL | Status: DC | PRN

## 2012-10-21 MED ORDER — KETOROLAC TROMETHAMINE 60 MG/2ML IM SOLN
60.0000 mg | Freq: Once | INTRAMUSCULAR | Status: AC
  Administered 2012-10-21: 60 mg via INTRAMUSCULAR

## 2012-10-21 NOTE — Progress Notes (Signed)
  Subjective:    Patient ID: Tammy Reed, female    DOB: 03/21/1966, 46 y.o.   MRN: [id number]  HPI HA x 2 days, throbbing, over the right temple. No URI sxs.  Took Advil with no relief. No nausea. HA is 7/10.  No neck pain. Says gets aobut 1 migraine per month, usu when stressed at work.    Review of Systems     Objective:   Physical Exam  Constitutional: She is oriented to person, place, and time. She appears well-developed and well-nourished.  HENT:  Head: Normocephalic and atraumatic.  Right Ear: External ear normal.  Left Ear: External ear normal.  Nose: Nose normal.  Mouth/Throat: Oropharynx is clear and moist.       TMs and canals are clear.   Eyes: Conjunctivae normal and EOM are normal. Pupils are equal, round, and reactive to light.  Neck: Neck supple. No thyromegaly present.  Cardiovascular: Normal rate, regular rhythm and normal heart sounds.   Pulmonary/Chest: Effort normal and breath sounds normal. She has no wheezes.  Lymphadenopathy:    She has no cervical adenopathy.  Neurological: She is alert and oriented to person, place, and time. No cranial nerve deficit.  Skin: Skin is warm and dry.  Psychiatric: She has a normal mood and affect.          Assessment & Plan:  Migraine - Acute.  Toradol inj 60mg  for acute relief. No nausea so didn't give phenergan.  Would also like her to try imitrex prn for HA.  F/u if not improving.  Given. H.O on migraines. Aslo discussed since has aura not a candidate for estrogen containing birth control.

## 2012-10-21 NOTE — Addendum Note (Signed)
Addended by: Judie Petit A on: 10/21/2012 01:27 PM   Modules accepted: Orders

## 2012-10-21 NOTE — Patient Instructions (Signed)
Migraine Headache A migraine headache is an intense, throbbing pain on one or both sides of your head. A migraine can last for 30 minutes to several hours. CAUSES  The exact cause of a migraine headache is not always known. However, a migraine may be caused when nerves in the brain become irritated and release chemicals that cause inflammation. This causes pain. SYMPTOMS  Pain on one or both sides of your head.  Pulsating or throbbing pain.  Severe pain that prevents daily activities.  Pain that is aggravated by any physical activity.  Nausea, vomiting, or both.  Dizziness.  Pain with exposure to bright lights, loud noises, or activity.  General sensitivity to bright lights, loud noises, or smells. Before you get a migraine, you may get warning signs that a migraine is coming (aura). An aura may include:  Seeing flashing lights.  Seeing bright spots, halos, or zig-zag lines.  Having tunnel vision or blurred vision.  Having feelings of numbness or tingling.  Having trouble talking.  Having muscle weakness. MIGRAINE TRIGGERS  Alcohol.  Smoking.  Stress.  Menstruation.  Aged cheeses.  Foods or drinks that contain nitrates, glutamate, aspartame, or tyramine.  Lack of sleep.  Chocolate.  Caffeine.  Hunger.  Physical exertion.  Fatigue.  Medicines used to treat chest pain (nitroglycerine), birth control pills, estrogen, and some blood pressure medicines. DIAGNOSIS  A migraine headache is often diagnosed based on:  Symptoms.  Physical examination.  A CT scan or MRI of your head. TREATMENT Medicines may be given for pain and nausea. Medicines can also be given to help prevent recurrent migraines.  HOME CARE INSTRUCTIONS  Only take over-the-counter or prescription medicines for pain or discomfort as directed by your caregiver. The use of long-term narcotics is not recommended.  Lie down in a dark, quiet room when you have a migraine.  Keep a journal  to find out what may trigger your migraine headaches. For example, write down:  What you eat and drink.  How much sleep you get.  Any change to your diet or medicines.  Limit alcohol consumption.  Quit smoking if you smoke.  Get 7 to 9 hours of sleep, or as recommended by your caregiver.  Limit stress.  Keep lights dim if bright lights bother you and make your migraines worse. SEEK IMMEDIATE MEDICAL CARE IF:   Your migraine becomes severe.  You have a fever.  You have a stiff neck.  You have vision loss.  You have muscular weakness or loss of muscle control.  You start losing your balance or have trouble walking.  You feel faint or pass out.  You have severe symptoms that are different from your first symptoms. MAKE SURE YOU:   Understand these instructions.  Will watch your condition.  Will get help right away if you are not doing well or get worse. Document Released: 11/04/2005 Document Revised: 01/27/2012 Document Reviewed: 10/25/2011 [employer] Patient Information 2013 [employer].  

## 2012-10-29 ENCOUNTER — Ambulatory Visit: Payer: BC Managed Care – PPO | Admitting: Family Medicine

## 2012-11-03 ENCOUNTER — Ambulatory Visit: Payer: BC Managed Care – PPO | Admitting: Family Medicine

## 2012-11-03 DIAGNOSIS — Z0289 Encounter for other administrative examinations: Secondary | ICD-10-CM

## 2012-12-28 ENCOUNTER — Encounter: Payer: Self-pay | Admitting: Physician Assistant

## 2012-12-28 ENCOUNTER — Ambulatory Visit (INDEPENDENT_AMBULATORY_CARE_PROVIDER_SITE_OTHER): Payer: BC Managed Care – PPO | Admitting: Physician Assistant

## 2012-12-28 VITALS — BP 153/95 | HR 70 | Wt 213.0 lb

## 2012-12-28 DIAGNOSIS — Z8639 Personal history of other endocrine, nutritional and metabolic disease: Secondary | ICD-10-CM

## 2012-12-28 DIAGNOSIS — R0789 Other chest pain: Secondary | ICD-10-CM

## 2012-12-28 DIAGNOSIS — E669 Obesity, unspecified: Secondary | ICD-10-CM

## 2012-12-28 DIAGNOSIS — R030 Elevated blood-pressure reading, without diagnosis of hypertension: Secondary | ICD-10-CM

## 2012-12-28 NOTE — Progress Notes (Addendum)
  Subjective:    Patient ID: Tammy Reed, female    DOB: [birth year], 47 y.o.   MRN: [id number]  HPI Patient presents to the clinic today with chest tightness and pressure since 5am this morning on her way home from work. Chest pain and tightness only on left side that she decribes as dull and aching 5/10. She does not know of any triggers. She does not remember any trauma but she always does a lot of heavy lifting at work. She has not ever had anything like this before. Denies any jaw pain or radiating chest pain. She took one ibuprofen tab of 200mg  that did not help. Bilateral hands do feel numb but that has been an ongoing problem. She does have a hx of hypokalemia but has not been taking her medication. Denies any fever, n/v/d. Denies any slurred speech, muscle weakness, trouble walking, or vision changes. Mother did have heart disease. Not had recent labs.    Review of Systems     Objective:   Physical Exam  Constitutional: She is oriented to person, place, and time. She appears well-developed and well-nourished.  Obesity.  HENT:  Head: Normocephalic and atraumatic.  Eyes: Conjunctivae are normal.  Cardiovascular: Normal rate, regular rhythm and normal heart sounds.   Pulmonary/Chest: Effort normal and breath sounds normal.  Musculoskeletal: Normal range of motion.  Tenderness to palpation over left chest wall.  Neurological: She is alert and oriented to person, place, and time. She has normal reflexes. No cranial nerve deficit. Coordination normal.  Negative rombergs sign.  Skin: Skin is warm and dry.  Psychiatric: She has a normal mood and affect. Her behavior is normal.          Assessment & Plan:  Chest tightness/pressure- EKG NSR, No ST depression or elevation, no blockages. since there was pain with palpation over the left side of chest I suspect musculoskelatal. Ice chest area 15-20 minutes. Take Mobic daily for next couple of days and muscle relaxer at bedtime. Gave lab  slip to get potassium drawn to make sure in normal range. Would also like to get cardiac enzymes. Discussed signs and symptoms of Heart attack and stroke. Could also be some indigestion and should try zantac OTC. If continues or worsens take 325 ASA and go to ER.     Elevated BP reading- discussed with patient the importance of BP control. She declines BP meds. She will work on low salt diet and weigh loss. Follow up in 1 month for BP recheck. Pt aware cannot give phentemine until BP is under control.   Pt aware needs CPE.

## 2012-12-28 NOTE — Patient Instructions (Addendum)
Ice chest area 15-20 minutes. Take Mobic daily for next couple of days and muscle relaxer at bedtime. Follow up in 1 month for BP recheck.   1.5 Gram Low Sodium Diet A 1.5 gram sodium diet restricts the amount of sodium in the diet to no more than 1.5 g or 1500 mg daily. The American Heart Association recommends Americans over the age of 79 to consume no more than 1500 mg of sodium each day to reduce the risk of developing high blood pressure. Research also shows that limiting sodium may reduce heart attack and stroke risk. Many foods contain sodium for flavor and sometimes as a preservative. When the amount of sodium in a diet needs to be low, it is important to know what to look for when choosing foods and drinks. The following includes some information and guidelines to help make it easier for you to adapt to a low sodium diet. QUICK TIPS  Do not add salt to food.  Avoid convenience items and fast food.  Choose unsalted snack foods.  Buy lower sodium products, often labeled as "lower sodium" or "no salt added."  Check food labels to learn how much sodium is in 1 serving.  When eating at a restaurant, ask that your food be prepared with less salt or none, if possible. READING FOOD LABELS FOR SODIUM INFORMATION The nutrition facts label is a good place to find how much sodium is in foods. Look for products with no more than 400 mg of sodium per serving. Remember that 1.5 g = 1500 mg. The food label may also list foods as:  Sodium-free: Less than 5 mg in a serving.  Very low sodium: 35 mg or less in a serving.  Low-sodium: 140 mg or less in a serving.  Light in sodium: 50% less sodium in a serving. For example, if a food that usually has 300 mg of sodium is changed to become light in sodium, it will have 150 mg of sodium.  Reduced sodium: 25% less sodium in a serving. For example, if a food that usually has 400 mg of sodium is changed to reduced sodium, it will have 300 mg of  sodium. CHOOSING FOODS Grains  Avoid: Salted crackers and snack items. Some cereals, including instant hot cereals. Bread stuffing and biscuit mixes. Seasoned rice or pasta mixes.  Choose: Unsalted snack items. Low-sodium cereals, oats, puffed wheat and rice, shredded wheat. English muffins and bread. Pasta. Meats  Avoid: Salted, canned, smoked, spiced, pickled meats, including fish and poultry. Bacon, ham, sausage, cold cuts, hot dogs, anchovies.  Choose: Low-sodium canned tuna and salmon. Fresh or frozen meat, poultry, and fish. Dairy  Avoid: Processed cheese and spreads. Cottage cheese. Buttermilk and condensed milk. Regular cheese.  Choose: Milk. Low-sodium cottage cheese. Yogurt. Sour cream. Low-sodium cheese. Fruits and Vegetables  Avoid: Regular canned vegetables. Regular canned tomato sauce and paste. Frozen vegetables in sauces. Olives. Rosita Fire. Relishes. Sauerkraut.  Choose: Low-sodium canned vegetables. Low-sodium tomato sauce and paste. Frozen or fresh vegetables. Fresh and frozen fruit. Condiments  Avoid: Canned and packaged gravies. Worcestershire sauce. Tartar sauce. Barbecue sauce. Soy sauce. Steak sauce. Ketchup. Onion, garlic, and table salt. Meat flavorings and tenderizers.  Choose: Fresh and dried herbs and spices. Low-sodium varieties of mustard and ketchup. Lemon juice. Tabasco sauce. Horseradish. SAMPLE 1.5 GRAM SODIUM MEAL PLAN Breakfast / Sodium (mg)  1 cup low-fat milk / 143 mg  1 whole-wheat English muffin / 240 mg  1 tbs heart-healthy margarine / 153 mg  1 hard-boiled egg / 139 mg  1 small orange / 0 mg Lunch / Sodium (mg)  1 cup raw carrots / 76 mg  2 tbs no salt added peanut butter / 5 mg  2 slices whole-wheat bread / 270 mg  1 tbs jelly / 6 mg   cup red grapes / 2 mg Dinner / Sodium (mg)  1 cup whole-wheat pasta / 2 mg  1 cup low-sodium tomato sauce / 73 mg  3 oz lean ground beef / 57 mg  1 small side salad (1 cup raw spinach  leaves,  cup cucumber,  cup yellow bell pepper) with 1 tsp olive oil and 1 tsp red wine vinegar / 25 mg Snack / Sodium (mg)  1 container low-fat vanilla yogurt / 107 mg  3 graham cracker squares / 127 mg Nutrient Analysis  Calories: 1745  Protein: 75 g  Carbohydrate: 237 g  Fat: 57 g  Sodium: 1425 mg Document Released: 11/04/2005 Document Revised: 01/27/2012 Document Reviewed: 02/05/2010 [facility] Patient Information 2013 [place], [place].

## 2012-12-29 ENCOUNTER — Encounter: Payer: Self-pay | Admitting: *Deleted

## 2013-01-22 ENCOUNTER — Ambulatory Visit: Payer: BC Managed Care – PPO | Admitting: Physician Assistant

## 2013-01-27 ENCOUNTER — Ambulatory Visit: Payer: BC Managed Care – PPO | Admitting: Physician Assistant

## 2013-02-01 ENCOUNTER — Ambulatory Visit (INDEPENDENT_AMBULATORY_CARE_PROVIDER_SITE_OTHER): Payer: BC Managed Care – PPO | Admitting: Physician Assistant

## 2013-02-01 ENCOUNTER — Encounter: Payer: Self-pay | Admitting: Physician Assistant

## 2013-02-01 VITALS — BP 156/90 | HR 73 | Wt 222.0 lb

## 2013-02-01 DIAGNOSIS — I10 Essential (primary) hypertension: Secondary | ICD-10-CM

## 2013-02-01 DIAGNOSIS — E669 Obesity, unspecified: Secondary | ICD-10-CM

## 2013-02-01 MED ORDER — LORCASERIN HCL 10 MG PO TABS: 10.0000 mg | ORAL_TABLET | Freq: Two times a day (BID) | ORAL | Status: DC

## 2013-02-01 MED ORDER — LISINOPRIL-HYDROCHLOROTHIAZIDE 20-25 MG PO TABS: 1.0000 | ORAL_TABLET | Freq: Every day | ORAL | Status: DC

## 2013-02-01 MED ORDER — LORCASERIN HCL 10 MG PO TABS: 10.0000 [IU] | ORAL_TABLET | Freq: Two times a day (BID) | ORAL | Status: DC

## 2013-02-01 NOTE — Progress Notes (Signed)
  Subjective:    Patient ID: Tammy Reed, female    DOB: [birth year], 47 y.o.   MRN: [id number]  HPI Patient presents to the clinic to follow up on Hypertension and to continue discussion of weight loss.  Pt wants to start on phentermine but BP is not controlled. She currently not on any meds we were trying to control with diet and exercise. She has tried to exercise but not been regular about it. Denies any CP, palpations, sOB, HA's, vision changes. Hard for her to stay focused with a diet. She really wants to lose weight for a special occasion in may.    Review of Systems     Objective:   Physical Exam  Constitutional: She is oriented to person, place, and time. She appears well-developed and well-nourished.  Obese.  HENT:  Head: Normocephalic and atraumatic.  Cardiovascular: Normal rate, regular rhythm and normal heart sounds.   Pulmonary/Chest: Effort normal and breath sounds normal. She has no wheezes.  Neurological: She is alert and oriented to person, place, and time.  Skin: Skin is warm and dry.  Psychiatric: She has a normal mood and affect. Her behavior is normal.          Assessment & Plan:  HTN- will start on lisinopril/HCTZ daily. Remember low salt diet and exercise. Follow up in 1 month.  Obesity/weight loss-Not going to refill phentermine until BP under control. Discussed Belviq to try. Worried it will be too expensive but patient wants to try. Will give free sample and rx to get filled if like. Follow up in 1 month. Discussed diet changes and exercise.  Discussed need CPE.

## 2013-03-03 ENCOUNTER — Ambulatory Visit (INDEPENDENT_AMBULATORY_CARE_PROVIDER_SITE_OTHER): Payer: BC Managed Care – PPO | Admitting: Physician Assistant

## 2013-03-03 ENCOUNTER — Encounter: Payer: Self-pay | Admitting: Physician Assistant

## 2013-03-03 VITALS — BP 122/84 | HR 79 | Wt 218.0 lb

## 2013-03-03 DIAGNOSIS — E669 Obesity, unspecified: Secondary | ICD-10-CM

## 2013-03-03 DIAGNOSIS — I10 Essential (primary) hypertension: Secondary | ICD-10-CM | POA: Insufficient documentation

## 2013-03-03 DIAGNOSIS — K5900 Constipation, unspecified: Secondary | ICD-10-CM

## 2013-03-03 DIAGNOSIS — Z30432 Encounter for removal of intrauterine contraceptive device: Secondary | ICD-10-CM

## 2013-03-03 MED ORDER — LISINOPRIL-HYDROCHLOROTHIAZIDE 20-25 MG PO TABS: 1.0000 | ORAL_TABLET | Freq: Every day | ORAL | Status: DC

## 2013-03-03 MED ORDER — LORCASERIN HCL 10 MG PO TABS: 10.0000 mg | ORAL_TABLET | Freq: Two times a day (BID) | ORAL | Status: DC

## 2013-03-03 NOTE — Patient Instructions (Signed)
Miralax 1 capful daily. Increasing fiber to 30g a day. Make sure drinking 40-64 oz water.

## 2013-03-03 NOTE — Progress Notes (Signed)
  Subjective:    Patient ID: Tammy Reed, female    DOB: January 14, 1966, 47 y.o.   MRN: [id number]  HPI Patient presents to the clinic to have her IUD removed and to follow up on Hypertension/obesity.  Pt has had mirena for 5 years and wants removed.   HTN- controlled. Taking medication daily. Admits to saltly diet. Not currently exercising. Denies any CP, palpitations, SOB, HA's.   Obesity-really like belviq. Wants refill. Feels like it does make her less hungry. Lost 5 lbs since last visit.   Pt talks about ongoing contstipation. She has used OTC laxitives and they help. She tries to drink water. Average bowel movement is hard and pellet like every 3 to 4 days.    Review of Systems     Objective:   Physical Exam  Constitutional: She is oriented to person, place, and time. She appears well-developed and well-nourished.  HENT:  Head: Normocephalic and atraumatic.  Cardiovascular: Normal rate, regular rhythm and normal heart sounds.   Pulmonary/Chest: Effort normal and breath sounds normal. She has no wheezes.  Genitourinary: Vaginal discharge found.  Brownish discharge. Cervix normal with IUD strings coming from cervix.  Neurological: She is alert and oriented to person, place, and time.  Psychiatric: She has a normal mood and affect. Her behavior is normal.          Assessment & Plan:  IUD removal-Forceps were used to grasp IUD strings and removed without complications. Pt does not want to start any new birth control at this time. If becomes sexual activity would not be protected.   HTN- Rechecked BP and was normal. Refilled lisinopril. Reminded of low salt diet. Discussed regular exercise. Follow up in 6 months.   Obesity-Refilled belviq.discussed trials and that max weight loss benefit occurs in the 6-12 month range. Talked about calories and regular exercise.  Follow up in 3 months.   Constipation- Discussed miralax daily. Increase fiber 30g. Drink water regularly. If not  improving then call office and can consider other options.

## 2013-03-08 ENCOUNTER — Telehealth: Payer: Self-pay | Admitting: *Deleted

## 2013-03-08 ENCOUNTER — Encounter: Payer: Self-pay | Admitting: *Deleted

## 2013-03-08 MED ORDER — PHENTERMINE HCL 37.5 MG PO CAPS: 37.5000 mg | ORAL_CAPSULE | ORAL | Status: DC

## 2013-03-08 NOTE — Telephone Encounter (Signed)
Pt.notified

## 2013-03-08 NOTE — Telephone Encounter (Signed)
Pt calls & asks if you can prescribe phentermine & send it to [facility] instead of the belviq because she states that it's too expensive.  Please advise

## 2013-03-08 NOTE — Telephone Encounter (Signed)
Since took mirena out she is not protected against tetragenic effects of phentermine on fetus. Sent to pharmacy. Needs follow up in 1 month to check BP, HR and weight.

## 2013-05-28 ENCOUNTER — Ambulatory Visit: Payer: BC Managed Care – PPO | Admitting: Physician Assistant

## 2013-06-07 ENCOUNTER — Ambulatory Visit: Payer: BC Managed Care – PPO | Admitting: Physician Assistant

## 2013-06-07 DIAGNOSIS — Z0289 Encounter for other administrative examinations: Secondary | ICD-10-CM

## 2013-06-11 ENCOUNTER — Ambulatory Visit: Payer: BC Managed Care – PPO | Admitting: Physician Assistant

## 2013-06-14 ENCOUNTER — Ambulatory Visit (INDEPENDENT_AMBULATORY_CARE_PROVIDER_SITE_OTHER): Payer: BC Managed Care – PPO | Admitting: Physician Assistant

## 2013-06-14 ENCOUNTER — Encounter: Payer: Self-pay | Admitting: Physician Assistant

## 2013-06-14 VITALS — BP 158/94 | HR 72 | Wt 217.0 lb

## 2013-06-14 DIAGNOSIS — I10 Essential (primary) hypertension: Secondary | ICD-10-CM

## 2013-06-14 DIAGNOSIS — E785 Hyperlipidemia, unspecified: Secondary | ICD-10-CM

## 2013-06-14 DIAGNOSIS — E669 Obesity, unspecified: Secondary | ICD-10-CM

## 2013-06-14 DIAGNOSIS — Z131 Encounter for screening for diabetes mellitus: Secondary | ICD-10-CM

## 2013-06-14 DIAGNOSIS — N912 Amenorrhea, unspecified: Secondary | ICD-10-CM

## 2013-06-14 MED ORDER — LORCASERIN HCL 10 MG PO TABS: 10.0000 mg | ORAL_TABLET | Freq: Two times a day (BID) | ORAL | Status: DC

## 2013-06-14 NOTE — Patient Instructions (Addendum)
Call insurance about belviq  1200 Calorie Diabetic Diet The 1200 calorie diabetic diet limits calories to 1200 each day. Following this diet and making healthy meal choices can help improve overall health. It controls blood glucose (sugar) levels and can also help lower blood pressure and cholesterol.  SERVING SIZES Measuring foods and serving sizes helps to make sure you are getting the right amount of food. The list below tells how big or small some common serving sizes are.   1 oz.........4 stacked dice.  3 oz........Marland KitchenDeck of cards.  1 tsp.......Marland KitchenTip of little finger.  1 tbs......Marland KitchenMarland KitchenThumb.  2 tbs.......Marland KitchenGolf ball.   cup......Marland KitchenHalf of a fist.  1 cup.......Marland KitchenA fist. GUIDELINES FOR CHOOSING FOODS The goal of this diet is to eat a variety of foods and limit calories to 1200 each day. This can be done by choosing foods that are low in calories and fat. The diet also suggests eating small amounts of food frequently. Doing this helps control your blood glucose levels, so they do not get too high or too low. Each meal or snack may include a protein food source to help you feel more satisfied. Try to eat about the same amount of food around the same time each day. This includes weekend days, travel days, and days off work. Space your meals about 4 to 5 hours apart, and add a snack between them, if you wish.  For example, a daily food plan could include breakfast, a morning snack, lunch, dinner, and an evening snack. Healthy meals and snacks have different types of foods, including whole grains, vegetables, fruits, lean meats, poultry, fish, and dairy products. As you plan your meals, select a variety of foods. Choose from the bread and starch, vegetable, fruit, dairy, and meat/protein groups. Examples of foods from each group are listed below, with their suggested serving sizes. Use measuring cups and spoons to become familiar with what a healthy portion looks like. Bread and Starch Each serving  equals 15 grams of carbohydrate.  1 slice bread.   bagel.   cup cold cereal (unsweetened).   cup hot cereal or mashed potatoes.  1 small potato (size of a computer mouse).   cup cooked pasta or rice.   English muffin.  1 cup broth-based soup.  3 cups of popcorn.  4 to 6 whole-wheat crackers.   cup cooked beans, peas, or corn. Vegetables Each serving equals 5 grams of carbohydrate.   cup cooked vegetables.  1 cup raw vegetables.   cup tomato or vegetable juice. Fruit Each serving equals 15 grams of carbohydrate.  1 small apple or orange.  1  cup watermelon or strawberries.   cup applesauce (no sugar added).  2 tbs raisins.   banana.   cup canned fruit, packed in water or in its own juice.   cup unsweetened fruit juice. Dairy Each serving equals 12 to 15 grams of carbohydrate.  1 cup fat-free milk.  6 oz artificially sweetened yogurt or plain yogurt.  1 cup low-fat buttermilk.  1 cup soy milk.  1 cup almond milk. Meat/Protein  1 large egg.  2 to 3 oz meat, poultry, or fish.   cup low-fat cottage cheese.  1 tbs peanut butter.  1 oz low-fat cheese.   cup tuna, packed in water.   cup tofu. Fat  1 tsp oil.  1 tsp trans-fat-free margarine.  1 tsp butter.  1 tsp mayonnaise.  2 tbs avocado.  1 tbs salad dressing.  1 tbs cream cheese.  2 tbs  sour cream. SAMPLE 1200 CALORIE DIET PLAN Breakfast  1 cup fat-free milk (1 carb serving).  1 small orange (1 carb serving).  1 scrambled egg.  1 slice whole-wheat toast (1 carb serving). Lunch  Sandwich  2 slices whole-wheat bread (2 carb servings).  2 oz lean meat.  2 tsp reduced fat mayonnaise.  1 lettuce leaf.  2 slices tomato.  1 cup carrot sticks. Afternoon Snack  1 small apple (1 carb serving).  1 string cheese. Dinner  2 oz meat.  1 small baked potato (1 carb serving).  1 tsp trans-fat-free margarine.  1 cup steamed broccoli.  1 cup  fat-free milk (1 carb serving). Evening Snack   small banana (1 carb serving).  6 vanilla wafers (1 carb serving). MEAL PLAN You can use this worksheet to help you make a daily meal plan based on the 1200 calorie diabetic diet suggestions. If you are using this plan to help you control your blood glucose, you may interchange carbohydrate containing foods (dairy, starches, and fruits). Select a variety of fresh foods of varying colors and flavors. The total amount of carbohydrate in your meals or snacks is more important than making sure you include all of the food groups every time you eat. You can choose from approximately this many of the following foods to build your day's meals:  6 Starches.  3 Vegetables.  2 Fruits.  2 Dairy.  4 to 6 oz Meat/Protein.  Up to 3 Fats. Your dietician can use this worksheet to help you decide how many servings and which types of foods are right for you. BREAKFAST Food Group and Servings / Food Choice Starch _________________________________________________________ Dairy __________________________________________________________ Fruit ___________________________________________________________ Meat/Protein____________________________________________________ Fat ____________________________________________________________ LUNCH Food Group and Servings / Food Choice  Starch _________________________________________________________ Meat/Protein ___________________________________________________ Vegetables _____________________________________________________ Fruit __________________________________________________________ Dairy __________________________________________________________ Fat ____________________________________________________________ Tammy Reed Food Group and Servings / Food Choice Dairy __________________________________________________________ Fruit ___________________________________________________________ Starch  __________________________________________________________ Meat/Protein____________________________________________________ Tammy Reed Food Group and Servings / Food Choice Starch _________________________________________________________ Meat/Protein ___________________________________________________ Dairy __________________________________________________________ Vegetables _____________________________________________________ Fruit __________________________________________________________ Fat ____________________________________________________________ Tammy Reed Food Group and Servings / Food Choice Fruit ___________________________________________________________ Meat/Protein ____________________________________________________ Dairy __________________________________________________________ Starch __________________________________________________________ DAILY TOTALS Starches _________________________ Vegetables _______________________ Fruits ____________________________ Dairy ____________________________ Meat/Protein______________________ Fats _____________________________ Document Released: 05/27/2005 Document Revised: 01/27/2012 Document Reviewed: 09/21/2009 Exit[employer] Patient Information 2014 [employer]

## 2013-06-14 NOTE — Progress Notes (Signed)
  Subjective:    Patient ID: Tammy Reed, female    DOB: 11/21/1965, 47 y.o.   MRN: [id number]  HPI Patient presents to the clinic to follow up on HTN and weight loss.   HTN-she did not take BP medication this am. She also admits to missing several doses over course of month. Denies any CP, palpitations, SOB, HA's, vision changes. She does not keep to a low salt diet and does not exercise.  She has lost a pound since last visit in April. She stayed on phentermine for one month. She has not been watching what she eats and not exercising. She did try sample of beliviq and liked it a lot. She did not get rx due to cost.   Pt has not had a period since IUD taken out in April. No sexual actvity in 8 months. No pain, abdominal cramps, discharge. Pt is under a lot of stress.    Review of Systems     Objective:   Physical Exam  Constitutional: She is oriented to person, place, and time. She appears well-developed and well-nourished.  obese  HENT:  Head: Normocephalic and atraumatic.  Cardiovascular: Normal rate, regular rhythm and normal heart sounds.   Pulmonary/Chest: Effort normal and breath sounds normal.  Neurological: She is alert and oriented to person, place, and time.  Skin: Skin is warm and dry.  Psychiatric: She has a normal mood and affect. Her behavior is normal.          Assessment & Plan:  HTN- discussed with patient importance of taking medication daily and coming into office on medication to see progress. Reminded of low salt diet. Refilled medication. Told to keep log and let me know what CP are running. Follow up in 2-3 months. If BP not below 140/90 come in sooner.  Obesity- I do not feel comfortable giving phentermine with CP that high and pt not compliant with medication. I suggested belviq again. Gave another rx and told her to make sure pharmacy ran savings card. Diet and exercise are also important variables to losing weight.   Amenorrhea- due to lack of sexual  actvity i did not do a pregnancy test. Could be stress. Instructed pt to make another appt for full work up of amenorrhea.  Gave lab slip for fasting labs.

## 2013-06-21 ENCOUNTER — Telehealth: Payer: Self-pay | Admitting: *Deleted

## 2013-06-21 NOTE — Telephone Encounter (Signed)
I need a better blood pressure reading in order to give phentermine. If can come in and get rechecked I would be more than happy to start on phentermine. Phentermine can increase BP and pulse and I do not want to be dangerous for you.

## 2013-06-21 NOTE — Telephone Encounter (Signed)
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

## 2013-06-21 NOTE — Telephone Encounter (Signed)
Pt calls & states that she is unable to get the belviq right now & would like for you to send her in phentermine instead.

## 2013-06-25 ENCOUNTER — Encounter: Payer: Self-pay | Admitting: Physician Assistant

## 2013-06-25 ENCOUNTER — Ambulatory Visit (INDEPENDENT_AMBULATORY_CARE_PROVIDER_SITE_OTHER): Payer: BC Managed Care – PPO | Admitting: Physician Assistant

## 2013-06-25 VITALS — BP 127/75 | HR 69 | Wt 215.0 lb

## 2013-06-25 DIAGNOSIS — R635 Abnormal weight gain: Secondary | ICD-10-CM

## 2013-06-25 DIAGNOSIS — I10 Essential (primary) hypertension: Secondary | ICD-10-CM

## 2013-06-25 DIAGNOSIS — E669 Obesity, unspecified: Secondary | ICD-10-CM

## 2013-06-25 MED ORDER — PHENTERMINE HCL 37.5 MG PO CAPS: 37.5000 mg | ORAL_CAPSULE | ORAL | Status: DC

## 2013-06-25 NOTE — Progress Notes (Signed)
  Subjective:    Patient ID: Tammy Reed, female    DOB: 1966/06/05, 47 y.o.   MRN: [id number]  HPI Patient reports to the clinic to followup on hypertension and to discuss starting phentermine.  The last couple visits patient has not been on her blood pressure medication when her blood pressure was checked. Today she is taken her medication and her blood pressure looks fantastic. Patient denies any chest pains, palpitations, shortness of breath, numbness and tingling, headaches.  Patient is desiring to lose weight at this time. She is aware of the importance of diet and exercise. She has started walking regularly. We given her pelvic but she was unable to afford. She would like to try phentermine at this time. She has tried phentermine in the past and has worked well to help her start losing weight.   Review of Systems     Objective:   Physical Exam  Constitutional: She is oriented to person, place, and time. She appears well-developed and well-nourished.  Obese  HENT:  Head: Normocephalic and atraumatic.  Cardiovascular: Normal rate, regular rhythm and normal heart sounds.   Pulmonary/Chest: Effort normal and breath sounds normal. She has no wheezes.  Neurological: She is alert and oriented to person, place, and time.  Psychiatric: She has a normal mood and affect. Her behavior is normal.          Assessment & Plan:  Hypertension-patient's BP is well-controlled on lisinopril/HCTZ. Patient does not need refill at this time. Continue to watch salt and will recheck in the next 6 months.  Obesity/weight gain-BP and pulse controlled today. Will start phentermine for one month. Discussed with patient in detail that this is only one part of the weight loss picture. Patient was encouraged to start a 1200-calorie diet. Patient was also encouraged to start exercising at the minimum 150 minutes a week. She was also given some information and how to use with Smart phone such as Fitness pro.  Goal weight loss for this month with the 7 pounds. Discussed with patient side effects of phentermine such as insomnia or palpitations. Patient is aware will call if she has any of these symptoms. Followup in one month.

## 2013-06-25 NOTE — Patient Instructions (Signed)
1200 calorie. Fitness pal.  Goal needs to be 150 minutes of exercise a week.

## 2013-06-25 NOTE — Telephone Encounter (Signed)
Notified pt in office this afternoon & front office has updated her phone #

## 2013-08-16 ENCOUNTER — Ambulatory Visit (INDEPENDENT_AMBULATORY_CARE_PROVIDER_SITE_OTHER): Payer: BC Managed Care – PPO | Admitting: Physician Assistant

## 2013-08-16 ENCOUNTER — Encounter: Payer: Self-pay | Admitting: Physician Assistant

## 2013-08-16 VITALS — BP 164/102 | HR 67 | Wt 212.0 lb

## 2013-08-16 DIAGNOSIS — R5383 Other fatigue: Secondary | ICD-10-CM

## 2013-08-16 DIAGNOSIS — R220 Localized swelling, mass and lump, head: Secondary | ICD-10-CM

## 2013-08-16 DIAGNOSIS — I10 Essential (primary) hypertension: Secondary | ICD-10-CM

## 2013-08-16 DIAGNOSIS — R5381 Other malaise: Secondary | ICD-10-CM

## 2013-08-16 MED ORDER — METHYLPREDNISOLONE SODIUM SUCC 125 MG IJ SOLR
125.0000 mg | Freq: Once | INTRAMUSCULAR | Status: AC
  Administered 2013-08-16: 125 mg via INTRAMUSCULAR

## 2013-08-16 MED ORDER — AMLODIPINE BESYLATE 5 MG PO TABS: 5.0000 mg | ORAL_TABLET | Freq: Every day | ORAL | Status: DC

## 2013-08-16 NOTE — Progress Notes (Signed)
  Subjective:    Patient ID: Tammy Reed, female    DOB: 08/28/1966, 47 y.o.   MRN: [id number]  HPI  Patient presents to the clinic to discuss mass on the left side of neck that she noticed yesterday. It is tender to touch. Patient denies any fever, chills, nausea or vomiting. She does feel dairy a day but has been going on for a couple months. She has for 21 days in row. She feels dizzy occasionally he reports that her head aches in the occipital region. Ibuprofen does help. Patient denies any sore throat, ear pain, sinus pressure or cough. Patient does have ongoing hypertension. She denies any chest pains or palpitations. She is taking lisinopril/HCTZ regularly.   Review of Systems     Objective:   Physical Exam  Constitutional: She is oriented to person, place, and time. She appears well-developed and well-nourished.  HENT:  Head: Normocephalic and atraumatic.  Right Ear: External ear normal.  Left Ear: External ear normal.  Nose: Nose normal.  Mouth/Throat: Oropharynx is clear and moist.  Eyes: Conjunctivae are normal.  Neck: Normal range of motion. Neck supple. No thyromegaly present.  Marble size mobile nodule following the sternocleidomastoid muscle and present at the base on the left side. It was tender to palpation. No warmth around it.  Cardiovascular: Normal rate, regular rhythm and normal heart sounds.   Pulmonary/Chest: Effort normal and breath sounds normal. She has no wheezes.  Neurological: She is alert and oriented to person, place, and time.  Skin: Skin is warm and dry.  Psychiatric: She has a normal mood and affect. Her behavior is normal.          Assessment & Plan:  Mass on the left side of neck/fatigue/headache- I. suspect a reactive lymph node. Will get a CBC today to look for any white count elevation. Since patient is so fatigued we'll get a TSH and a mono screen. Patient was instructed to keep her hands off the no ice and watch for any changes. If suddenly  larger or not resolving we can get an ultrasound of her neck. Encouraged patient to take ibuprofen every 3 hours for the next couple days. I did write patient out of work for the next 3 days to rest.  Hypertension-patient's blood pressure is not controlled today. I added Norvasc to lisinopril/HCTZ. Patient was instructed to return to the clinic in 2 weeks for blood pressure recheck. This blood pressure could certainly cause some of her headache.

## 2013-08-16 NOTE — Patient Instructions (Addendum)
Lymphadenopathy °Lymphadenopathy means "disease of the lymph glands." But the term is usually used to describe swollen or enlarged lymph glands, also called lymph nodes. These are the bean-shaped organs found in many locations including the neck, underarm, and groin. Lymph glands are part of the immune system, which fights infections in your body. Lymphadenopathy can occur in just one area of the body, such as the neck, or it can be generalized, with lymph node enlargement in several areas. The nodes found in the neck are the most common sites of lymphadenopathy. °CAUSES  °When your immune system responds to germs (such as viruses or bacteria ), infection-fighting cells and fluid build up. This causes the glands to grow in size. This is usually not something to worry about. Sometimes, the glands themselves can become infected and inflamed. This is called lymphadenitis. °Enlarged lymph nodes can be caused by many diseases: °· Bacterial disease, such as strep throat or a skin infection. °· Viral disease, such as a common cold. °· Other germs, such as lyme disease, tuberculosis, or sexually transmitted diseases. °· Cancers, such as lymphoma (cancer of the lymphatic system) or leukemia (cancer of the white blood cells). °· Inflammatory diseases such as lupus or rheumatoid arthritis. °· Reactions to medications. °Many of the diseases above are rare, but important. This is why you should see your caregiver if you have lymphadenopathy. °SYMPTOMS  °· Swollen, enlarged lumps in the neck, back of the head or other locations. °· Tenderness. °· Warmth or redness of the skin over the lymph nodes. °· Fever. °DIAGNOSIS  °Enlarged lymph nodes are often near the source of infection. They can help healthcare providers diagnose your illness. For instance:  °· Swollen lymph nodes around the jaw might be caused by an infection in the mouth. °· Enlarged glands in the neck often signal a throat infection. °· Lymph nodes that are swollen  in more than one area often indicate an illness caused by a virus. °Your caregiver most likely will know what is causing your lymphadenopathy after listening to your history and examining you. Blood tests, x-rays or other tests may be needed. If the cause of the enlarged lymph node cannot be found, and it does not go away by itself, then a biopsy may be needed. Your caregiver will discuss this with you. °TREATMENT  °Treatment for your enlarged lymph nodes will depend on the cause. Many times the nodes will shrink to normal size by themselves, with no treatment. Antibiotics or other medicines may be needed for infection. Only take over-the-counter or prescription medicines for pain, discomfort or fever as directed by your caregiver. °HOME CARE INSTRUCTIONS  °Swollen lymph glands usually return to normal when the underlying medical condition goes away. If they persist, contact your health-care provider. He/she might prescribe antibiotics or other treatments, depending on the diagnosis. Take any medications exactly as prescribed. Keep any follow-up appointments made to check on the condition of your enlarged nodes.  °SEEK MEDICAL CARE IF:  °· Swelling lasts for more than two weeks. °· You have symptoms such as weight loss, night sweats, fatigue or fever that does not go away. °· The lymph nodes are hard, seem fixed to the skin or are growing rapidly. °· Skin over the lymph nodes is red and inflamed. This could mean there is an infection. °SEEK IMMEDIATE MEDICAL CARE IF:  °· Fluid starts leaking from the area of the enlarged lymph node. °· You develop a fever of 102° F (38.9° C) or greater. °· Severe   pain develops (not necessarily at the site of a large lymph node). °· You develop chest pain or shortness of breath. °· You develop worsening abdominal pain. °MAKE SURE YOU:  °· Understand these instructions. °· Will watch your condition. °· Will get help right away if you are not doing well or get worse. °Document  Released: 08/13/2008 Document Revised: 01/27/2012 Document Reviewed: 08/13/2008 °[employer]® Patient Information ©2014 [employer]. ° °

## 2013-08-17 LAB — CBC WITH DIFFERENTIAL/PLATELET
Basophils Absolute: 0 10*3/uL (ref 0.0–0.1)
Basophils Relative: 0 % (ref 0–1)
Eosinophils Absolute: 0.1 10*3/uL (ref 0.0–0.7)
Eosinophils Relative: 2 % (ref 0–5)
HCT: 40.8 % (ref 36.0–46.0)
Hemoglobin: 14.1 g/dL (ref 12.0–15.0)
Lymphocytes Relative: 34 % (ref 12–46)
Lymphs Abs: 3 10*3/uL (ref 0.7–4.0)
MCH: 28.3 pg (ref 26.0–34.0)
MCHC: 34.6 g/dL (ref 30.0–36.0)
MCV: 81.8 fL (ref 78.0–100.0)
Monocytes Absolute: 0.5 10*3/uL (ref 0.1–1.0)
Monocytes Relative: 6 % (ref 3–12)
Neutro Abs: 5.1 10*3/uL (ref 1.7–7.7)
Neutrophils Relative %: 58 % (ref 43–77)
Platelets: 376 10*3/uL (ref 150–400)
RBC: 4.99 MIL/uL (ref 3.87–5.11)
RDW: 14.2 % (ref 11.5–15.5)
WBC: 8.8 10*3/uL (ref 4.0–10.5)

## 2013-08-17 LAB — FOLLICLE STIMULATING HORMONE: FSH: 55.1 m[IU]/mL

## 2013-08-17 LAB — TSH: TSH: 1.409 u[IU]/mL (ref 0.350–4.500)

## 2013-08-17 LAB — MONONUCLEOSIS SCREEN: Mono Screen: NEGATIVE

## 2013-09-08 ENCOUNTER — Encounter: Payer: Self-pay | Admitting: Physician Assistant

## 2013-09-08 ENCOUNTER — Ambulatory Visit (INDEPENDENT_AMBULATORY_CARE_PROVIDER_SITE_OTHER): Payer: BC Managed Care – PPO | Admitting: Physician Assistant

## 2013-09-08 VITALS — BP 137/81 | HR 79 | Temp 98.2°F | Wt 221.0 lb

## 2013-09-08 DIAGNOSIS — R519 Headache, unspecified: Secondary | ICD-10-CM

## 2013-09-08 DIAGNOSIS — R51 Headache: Secondary | ICD-10-CM

## 2013-09-08 DIAGNOSIS — K047 Periapical abscess without sinus: Secondary | ICD-10-CM

## 2013-09-08 DIAGNOSIS — H9201 Otalgia, right ear: Secondary | ICD-10-CM

## 2013-09-08 DIAGNOSIS — H9209 Otalgia, unspecified ear: Secondary | ICD-10-CM

## 2013-09-08 MED ORDER — AMOXICILLIN 875 MG PO TABS: 875.0000 mg | ORAL_TABLET | Freq: Two times a day (BID) | ORAL | Status: DC

## 2013-09-08 MED ORDER — HYDROCODONE-ACETAMINOPHEN 5-325 MG PO TABS: 1.0000 | ORAL_TABLET | Freq: Four times a day (QID) | ORAL | Status: DC | PRN

## 2013-09-08 NOTE — Progress Notes (Signed)
  Subjective:    Patient ID: Tammy Reed, female    DOB: 07/07/1966, 47 y.o.   MRN: [id number]  HPI Pt is a 47 yo female who presents to the clinic with right sided facial pain and tooth pain for the last week. She has noticed a bad taste in her mouth. Tooth pain seems to be coming from the Reed molars. She knows she has a lot of cavities and trying to save up money to get to the dentist. She denies any fever, chills, n/v/d. She has taken ibuprofen and does not help. She has taken her friends vicoden and helped significantly with pain. She even feels like her right ear is starting to hurt. Denies any discharge.    Review of Systems     Objective:   Physical Exam  Constitutional: She is oriented to person, place, and time. She appears well-developed and well-nourished.  HENT:  Head: Normocephalic and atraumatic.  Right Ear: External ear normal.  Left Ear: External ear normal.  Nose: Nose normal.  Mouth/Throat: Oropharynx is clear and moist. No oropharyngeal exudate.  Multiple dental caries. Completely broken off Reed molar with erythema and swelling. Tenderness over right maxillary sinus and over right upper jaw.   Eyes: Conjunctivae are normal.  Neck: Normal range of motion. Neck supple.  Cardiovascular: Normal rate, regular rhythm and normal heart sounds.   Pulmonary/Chest: Effort normal and breath sounds normal. She has no wheezes.  Lymphadenopathy:    She has no cervical adenopathy.  Neurological: She is alert and oriented to person, place, and time.  Skin: Skin is warm and dry.  Psychiatric: She has a normal mood and affect. Her behavior is normal.          Assessment & Plan:  Tooth pain/abcess/right facial pain- Gave amoxil for infection. Vicodin for pain. Discussed getting to dentist to have tooth looked at and pulled. Encouraged salt water gargle.

## 2013-10-08 ENCOUNTER — Ambulatory Visit: Payer: BC Managed Care – PPO | Admitting: Physician Assistant

## 2013-10-11 ENCOUNTER — Ambulatory Visit: Payer: BC Managed Care – PPO | Admitting: Physician Assistant

## 2013-10-13 ENCOUNTER — Ambulatory Visit: Payer: BC Managed Care – PPO | Admitting: Physician Assistant

## 2013-10-20 ENCOUNTER — Ambulatory Visit: Payer: BC Managed Care – PPO | Admitting: Physician Assistant

## 2013-11-05 ENCOUNTER — Ambulatory Visit: Payer: BC Managed Care – PPO | Admitting: Physician Assistant

## 2013-11-09 ENCOUNTER — Ambulatory Visit (INDEPENDENT_AMBULATORY_CARE_PROVIDER_SITE_OTHER): Payer: BC Managed Care – PPO | Admitting: Physician Assistant

## 2013-11-09 ENCOUNTER — Ambulatory Visit: Payer: BC Managed Care – PPO | Admitting: Physician Assistant

## 2013-11-09 ENCOUNTER — Encounter: Payer: Self-pay | Admitting: Physician Assistant

## 2013-11-09 VITALS — BP 145/77 | HR 74 | Wt 224.0 lb

## 2013-11-09 DIAGNOSIS — K044 Acute apical periodontitis of pulpal origin: Secondary | ICD-10-CM

## 2013-11-09 DIAGNOSIS — R519 Headache, unspecified: Secondary | ICD-10-CM

## 2013-11-09 DIAGNOSIS — R51 Headache: Secondary | ICD-10-CM

## 2013-11-09 DIAGNOSIS — K047 Periapical abscess without sinus: Secondary | ICD-10-CM

## 2013-11-09 MED ORDER — HYDROCODONE-ACETAMINOPHEN 5-325 MG PO TABS: 1.0000 | ORAL_TABLET | Freq: Four times a day (QID) | ORAL | Status: DC | PRN

## 2013-11-09 NOTE — Progress Notes (Signed)
   Subjective:    Patient ID: Tammy Reed, female    DOB: February 05, 1966, 47 y.o.   MRN: [id number]  HPI Patient is a 47 year old African American female who presents to the clinic with right sided facial pain. Patient has been diagnosed with in infected tooth. The tooth broke and she went to the dentist and they reported that it needed to be pulled. She cannot pull it until she gets her new insurance in January. Per patient she has an appointment on January 15 to have the tooth pulled. There is significant pain involved that radiates into the right side of her face. Hydrocodone has been helping. She requests pain medication until she can afford to have tooth pulled. Per patient she can use Tylenol during the day but at night it helps to have to hydrocodone. She denies any ear pain, sore throat, sinus pressure, fever, chills, cough or any other respiratory signs of infection.   Review of Systems     Objective:   Physical Exam  Constitutional: She is oriented to person, place, and time. She appears well-developed and well-nourished.  HENT:  Head: Normocephalic and atraumatic.  Right Ear: External ear normal.  Left Ear: External ear normal.  Nose: Nose normal.  Mouth/Throat: Oropharynx is clear and moist.  Poor dention. Upper right molar tooth cut at the bases with what appears to be a dental caries. No sign of abscess or signifcant swelling. Right side of face is not warm to the touch.   Eyes: Conjunctivae are normal. Right eye exhibits no discharge. Left eye exhibits no discharge.  Neck: Normal range of motion. Neck supple.  Cardiovascular: Normal rate, regular rhythm and normal heart sounds.   Pulmonary/Chest: Effort normal and breath sounds normal.  Lymphadenopathy:    She has no cervical adenopathy.  Neurological: She is alert and oriented to person, place, and time.  Skin: Skin is warm and dry.  Psychiatric: She has a normal mood and affect. Her behavior is normal.           Assessment & Plan:  Infected tooth/right-sided facial pain- will refill Norco quantity 30 to get her until January 15 surgery. Discussed warning signs of abscess or infection with patient. Call if sounds present. Discussed with her the need to have pulled and keep the January 15 appointment.

## 2014-01-11 ENCOUNTER — Ambulatory Visit: Payer: BC Managed Care – PPO | Admitting: Physician Assistant

## 2014-01-11 ENCOUNTER — Telehealth: Payer: Self-pay | Admitting: *Deleted

## 2014-01-11 MED ORDER — LORCASERIN HCL 10 MG PO TABS: 10.0000 mg | ORAL_TABLET | Freq: Two times a day (BID) | ORAL | Status: DC

## 2014-01-11 NOTE — Telephone Encounter (Signed)
belviq ordered.

## 2014-01-11 NOTE — Telephone Encounter (Signed)
Pt had an appt today but states that her car was stuck.  She wants to know if you will rx her the belviq. Please advise

## 2014-01-11 NOTE — Telephone Encounter (Signed)
Ok to start belviq 10mg  bid #60 no refills follow up within the month.

## 2014-01-17 ENCOUNTER — Telehealth: Payer: Self-pay | Admitting: *Deleted

## 2014-01-17 NOTE — Telephone Encounter (Signed)
Pt's information has been sent and placed in the folder.Loralee PacasBarkley, Jassmin Kemmerer FrontenacLynetta

## 2014-01-19 ENCOUNTER — Ambulatory Visit: Payer: BC Managed Care – PPO | Admitting: Physician Assistant

## 2014-04-01 ENCOUNTER — Ambulatory Visit: Payer: BC Managed Care – PPO | Admitting: Physician Assistant

## 2014-04-04 ENCOUNTER — Ambulatory Visit: Payer: BC Managed Care – PPO | Admitting: Physician Assistant

## 2014-04-06 ENCOUNTER — Ambulatory Visit: Payer: BC Managed Care – PPO | Admitting: Physician Assistant

## 2014-04-08 ENCOUNTER — Encounter: Payer: Self-pay | Admitting: Physician Assistant

## 2014-04-08 ENCOUNTER — Ambulatory Visit (INDEPENDENT_AMBULATORY_CARE_PROVIDER_SITE_OTHER): Payer: BC Managed Care – PPO | Admitting: Physician Assistant

## 2014-04-08 VITALS — BP 124/70 | HR 90 | Wt 236.0 lb

## 2014-04-08 DIAGNOSIS — E669 Obesity, unspecified: Secondary | ICD-10-CM

## 2014-04-08 DIAGNOSIS — Z6839 Body mass index (BMI) 39.0-39.9, adult: Secondary | ICD-10-CM

## 2014-04-08 DIAGNOSIS — K5900 Constipation, unspecified: Secondary | ICD-10-CM

## 2014-04-08 DIAGNOSIS — K5901 Slow transit constipation: Secondary | ICD-10-CM | POA: Insufficient documentation

## 2014-04-08 DIAGNOSIS — R635 Abnormal weight gain: Secondary | ICD-10-CM

## 2014-04-08 MED ORDER — TOPIRAMATE 50 MG PO TABS
ORAL_TABLET | ORAL | Status: DC
Start: 2014-04-08 — End: 2014-06-14

## 2014-04-08 MED ORDER — PHENTERMINE HCL 15 MG PO CAPS: 15.0000 mg | ORAL_CAPSULE | ORAL | Status: DC

## 2014-04-08 NOTE — Patient Instructions (Addendum)
miralax 1 capful nightly. Can do twice daily in order to have bowel movement.

## 2014-04-08 NOTE — Progress Notes (Signed)
   Subjective:    Patient ID: Tammy Reed, female    DOB: [birth year], 48 y.o.   MRN: [id number]  HPI Pt presents to the clinic with main concern being weight loss. She has tried belviq and not seemed to have any effect on weight loss. She's been elevate for at least one month. She's actually gained 12 pounds since last visit. She is struggling to get motivation because she has not seen results. She's tried to limit her proportions but seems trapped by sweets and soft drinks. She has a vacation planned for [facility] in September and really wants to feel good about herself.  Patient is also having problems with constipation. She has a long history of constipation. She has tried to laxatives the last 2 days with no bowel movement. She denies any abdominal pain or rectal pain. She denies any blood in her stool.   Review of Systems     Objective:   Physical Exam  Constitutional: She is oriented to person, place, and time. She appears well-developed and well-nourished.  HENT:  Head: Normocephalic and atraumatic.  Cardiovascular: Normal rate, regular rhythm and normal heart sounds.   Pulmonary/Chest: Effort normal and breath sounds normal.  Abdominal: Soft. Bowel sounds are normal. She exhibits no mass. There is no rebound and no guarding.  Slightly distended abdomen with no tenderness to palpation  Neurological: She is alert and oriented to person, place, and time.  Psychiatric: She has a normal mood and affect. Her behavior is normal.          Assessment & Plan:  Obesity/abnormal weight gain- will try phentermine and topamax. Topamax was started at 25 mg daily and increase to 50 mg daily. Continue with started at 15 mg daily. Discussed side effects. Pt has tried topamax before. Follow up in one month. Discussed 1200 calorie diet and exercise at least 30 minutes 4-5 times a week.    Constipation- discuss with patient to start MiraLax 1 capful twice a day for 5 days and then one capsule daily  as needed for complete bowel movements. Encourage patient to call office if not had a bowel movement in the next 3-4 days. Will consider linzess or amitiza. Increase hydration. Increase fiber.

## 2014-04-20 ENCOUNTER — Telehealth: Payer: Self-pay | Admitting: *Deleted

## 2014-04-20 ENCOUNTER — Other Ambulatory Visit: Payer: Self-pay | Admitting: *Deleted

## 2014-04-20 MED ORDER — PHENTERMINE HCL 15 MG PO TBDP: 15.0000 mg | ORAL_TABLET | Freq: Every day | ORAL | Status: DC

## 2014-04-20 NOTE — Telephone Encounter (Signed)
Tabs sent.  Pt notified.

## 2014-04-20 NOTE — Telephone Encounter (Signed)
Pt left vm asking if we could send her phentermine in tablets & not capsules because the capsules are $46.

## 2014-04-20 NOTE — Telephone Encounter (Signed)
Ok for phentermine tablets.

## 2014-06-07 ENCOUNTER — Ambulatory Visit: Payer: BC Managed Care – PPO | Admitting: Physician Assistant

## 2014-06-13 ENCOUNTER — Ambulatory Visit: Payer: BC Managed Care – PPO | Admitting: Physician Assistant

## 2014-06-14 ENCOUNTER — Ambulatory Visit (INDEPENDENT_AMBULATORY_CARE_PROVIDER_SITE_OTHER): Payer: BC Managed Care – PPO | Admitting: Physician Assistant

## 2014-06-14 ENCOUNTER — Encounter: Payer: Self-pay | Admitting: Physician Assistant

## 2014-06-14 VITALS — BP 121/80 | HR 80 | Ht 65.0 in | Wt 236.0 lb

## 2014-06-14 DIAGNOSIS — R635 Abnormal weight gain: Secondary | ICD-10-CM

## 2014-06-14 DIAGNOSIS — E669 Obesity, unspecified: Secondary | ICD-10-CM

## 2014-06-14 DIAGNOSIS — K089 Disorder of teeth and supporting structures, unspecified: Secondary | ICD-10-CM

## 2014-06-14 DIAGNOSIS — K0889 Other specified disorders of teeth and supporting structures: Secondary | ICD-10-CM

## 2014-06-14 DIAGNOSIS — K029 Dental caries, unspecified: Secondary | ICD-10-CM

## 2014-06-14 MED ORDER — PHENTERMINE HCL 37.5 MG PO TABS: 37.5000 mg | ORAL_TABLET | Freq: Every day | ORAL | Status: DC

## 2014-06-14 MED ORDER — HYDROCODONE-ACETAMINOPHEN 5-325 MG PO TABS: 1.0000 | ORAL_TABLET | Freq: Three times a day (TID) | ORAL | Status: DC | PRN

## 2014-06-14 MED ORDER — AMOXICILLIN-POT CLAVULANATE 875-125 MG PO TABS: 1.0000 | ORAL_TABLET | Freq: Two times a day (BID) | ORAL | Status: DC

## 2014-06-14 NOTE — Patient Instructions (Signed)
Will refer to nutritionist.  

## 2014-06-14 NOTE — Progress Notes (Signed)
   Subjective:    Patient ID: Tammy Reed, female    DOB: 1966/03/30, 48 y.o.   MRN: [id number]  HPI Pt presents to the clinic to follow up on obesity and weight gain. She did not like topamax/phentermine combination. She stated it made her feel weird.  She has not lost any weight since last visit. She admits she is not regularly exercising or keeping to a balanced diet. She is wanting to do better and go back on phentermine only.   She is also having left lower dental pain. She cannot afford dentist right now. Her insurance should kick in in 3 months. Pain is sharp and stabbing with bad taste in mouth. No fever, chills, nausea, vomiting or diarrhea.     Review of Systems  All other systems reviewed and are negative.      Objective:   Physical Exam  Constitutional: She is oriented to person, place, and time. She appears well-developed and well-nourished.  Obesity.   HENT:  Head: Normocephalic and atraumatic.  Poor dentition.  Dental caries of left lower molars.  Irritation and redness of gums.   Eyes: Conjunctivae are normal. Right eye exhibits no discharge. Left eye exhibits no discharge.  Neck: Normal range of motion. Neck supple.  Cardiovascular: Normal rate, regular rhythm and normal heart sounds.   Pulmonary/Chest: Effort normal and breath sounds normal. She has no wheezes.  Lymphadenopathy:    She has no cervical adenopathy.  Neurological: She is alert and oriented to person, place, and time.  Skin: Skin is dry.  Psychiatric: She has a normal mood and affect. Her behavior is normal.          Assessment & Plan:  Obesity/abnormal weight gain- stopped topamax. Started phentermine 37.5 daily. Discussed importance of diet and calorie counting. Kirsch patient to start tracking calories and stick to 1200-1500 calories a day. Also encourage regular daily to at least 4 times weekly exercise. Encouraged patient that she will not tendency weight loss if she does not take diet  changes. Patient agrees to go to nutritionist to help her on the right track.follow up nurse visit one month.   Dental pain/dental caries- recommended visit to dentist. Treated with small quantity of vicodin for pain and Augmentin. Encouraged to gargle with salt water.

## 2014-08-03 ENCOUNTER — Encounter: Payer: Self-pay | Admitting: Physician Assistant

## 2014-09-12 ENCOUNTER — Ambulatory Visit: Payer: BC Managed Care – PPO | Admitting: Physician Assistant

## 2014-09-16 ENCOUNTER — Ambulatory Visit: Payer: BC Managed Care – PPO | Admitting: Physician Assistant

## 2014-09-30 ENCOUNTER — Ambulatory Visit: Payer: BC Managed Care – PPO | Admitting: Physician Assistant

## 2014-10-07 ENCOUNTER — Ambulatory Visit: Payer: BC Managed Care – PPO | Admitting: Physician Assistant

## 2014-10-11 ENCOUNTER — Ambulatory Visit: Payer: BC Managed Care – PPO | Admitting: Physician Assistant

## 2014-10-21 ENCOUNTER — Ambulatory Visit: Payer: BC Managed Care – PPO | Admitting: Physician Assistant

## 2014-10-21 DIAGNOSIS — Z0289 Encounter for other administrative examinations: Secondary | ICD-10-CM

## 2014-11-16 ENCOUNTER — Encounter: Payer: Self-pay | Admitting: Physician Assistant

## 2014-11-16 ENCOUNTER — Ambulatory Visit (INDEPENDENT_AMBULATORY_CARE_PROVIDER_SITE_OTHER): Payer: BC Managed Care – PPO | Admitting: Physician Assistant

## 2014-11-16 VITALS — BP 169/85 | HR 83 | Ht 65.0 in | Wt 234.0 lb

## 2014-11-16 DIAGNOSIS — E669 Obesity, unspecified: Secondary | ICD-10-CM

## 2014-11-16 DIAGNOSIS — R635 Abnormal weight gain: Secondary | ICD-10-CM | POA: Diagnosis not present

## 2014-11-16 DIAGNOSIS — I10 Essential (primary) hypertension: Secondary | ICD-10-CM | POA: Diagnosis not present

## 2014-11-16 MED ORDER — LISINOPRIL-HYDROCHLOROTHIAZIDE 20-25 MG PO TABS: 1.0000 | ORAL_TABLET | Freq: Every day | ORAL | Status: DC

## 2014-11-16 MED ORDER — AMLODIPINE BESYLATE 5 MG PO TABS: 5.0000 mg | ORAL_TABLET | Freq: Every day | ORAL | Status: DC

## 2014-11-16 NOTE — Progress Notes (Signed)
   Subjective:    Patient ID: Tammy Reed, female    DOB: April 26, 1966, 48 y.o.   MRN: [id number]  HPI Pt presents to the clinic to discuss phentermine for weight loss. Her BP is out of control today. She has not taken her medication for BP in last 6 months. She denies any CP, palpitations, or vision changes. She just thought her blood pressure had been so good lately she did not need.    Review of Systems  All other systems reviewed and are negative.      Objective:   Physical Exam  Constitutional: She appears well-developed and well-nourished.  HENT:  Head: Normocephalic and atraumatic.  Cardiovascular: Normal rate, regular rhythm and normal heart sounds.   Pulmonary/Chest: Effort normal and breath sounds normal.  Psychiatric: She has a normal mood and affect. Her behavior is normal.          Assessment & Plan:  HTN- refilled both blood pressure medications. Discussed discuss importance of taking medications and keeping blood pressure down. Encouraged low-salt diet. Follow-up in one month.  Obesity/abnormal weight gain-discuss with patient we cannot start phentermine with uncontrolled blood pressure. Also discuss with her the importance of staying on phentermine for an extended time so that she can get the real benefit. In the past she stayed on it for 1 or 2 months and never followed up. If she is serious about weight loss and we need to have a conversation about a plan. She can follow up 1 month and see if blood pressure is better and can start medication.

## 2015-03-11 ENCOUNTER — Emergency Department (INDEPENDENT_AMBULATORY_CARE_PROVIDER_SITE_OTHER)
Admission: EM | Admit: 2015-03-11 | Discharge: 2015-03-11 | Disposition: A | Discharge status: Home / Self Care | Payer: BLUE CROSS/BLUE SHIELD | Source: Home / Self Care | Attending: Family Medicine | Admitting: Family Medicine

## 2015-03-11 ENCOUNTER — Encounter: Payer: Self-pay | Admitting: Emergency Medicine

## 2015-03-11 DIAGNOSIS — J209 Acute bronchitis, unspecified: Secondary | ICD-10-CM | POA: Diagnosis not present

## 2015-03-11 MED ORDER — BENZONATATE 200 MG PO CAPS: 200.0000 mg | ORAL_CAPSULE | Freq: Every day | ORAL | Status: DC

## 2015-03-11 MED ORDER — AZITHROMYCIN 250 MG PO TABS: ORAL_TABLET | ORAL | Status: DC

## 2015-03-11 NOTE — Discharge Instructions (Signed)
Take plain guaifenesin (1200mg  extended release tabs such as Mucinex) twice daily, with plenty of water, for cough and congestion.  Get adequate rest. May use Afrin nasal spray (or generic oxymetazoline) twice daily for about 5 days.  Also recommend using saline nasal spray several times daily and saline nasal irrigation (AYR is a common brand).   Try warm salt water gargles for sore throat.  Stop all antihistamines for now, and other non-prescription cough/cold preparations. May take Ibuprofen 200mg , 4 tabs every 8 hours with food for chest/sternum discomfort.   Follow-up with family doctor if not improving about 7 to10 days.

## 2015-03-11 NOTE — ED Notes (Signed)
P[facility] with congested dry cough nasal congestion fever,body aches times three days.

## 2015-03-11 NOTE — ED Provider Notes (Signed)
CSN: [id number]     Arrival date & time 03/11/15  1325 History   First MD Initiated Contact with Patient 03/11/15 1416     Chief Complaint  Patient presents with  . Cough  . Nasal Congestion      HPI Comments: Patient complains of four day history of typical cold-like symptoms including mild sore throat, sinus congestion, myalgias, fatigue, and cough.  She continues to have low grade fever.  The history is provided by the patient.    History reviewed. No pertinent past medical history. History reviewed. No pertinent past surgical history. Family History  Problem Relation Age of Onset  . Heart disease Mother   . Depression Brother    History  Substance Use Topics  . Smoking status: Never Smoker   . Smokeless tobacco: Never Used  . Alcohol Use: No   OB History    No data available     Review of Systems + sore throat + cough + sneezing No pleuritic pain No wheezing + nasal congestion + post-nasal drainage No sinus pain/pressure No itchy/red eyes ? right earache No hemoptysis No SOB + fever, + chills No nausea No vomiting No abdominal pain No diarrhea No urinary symptoms No skin rash + fatigue + myalgias No headache Used OTC meds without relief  Allergies  Review of patient's allergies indicates no known allergies.  Home Medications   Prior to Admission medications   Medication Sig Start Date End Date Taking? Authorizing Provider  amLODipine (NORVASC) 5 MG tablet Take 1 tablet (5 mg total) by mouth daily. 11/16/14   Jade L Breeback, PA-C  azithromycin (ZITHROMAX Z-PAK) 250 MG tablet Take 2 tabs today; then begin one tab once daily for 4 more days. 03/11/15   Lattie Haw, MD  benzonatate (TESSALON) 200 MG capsule Take 1 capsule (200 mg total) by mouth at bedtime. Take as needed for cough 03/11/15   Lattie Haw, MD  HYDROcodone-acetaminophen (NORCO/VICODIN) 5-325 MG per tablet Take 1 tablet by mouth every 8 (eight) hours as needed for moderate pain.  06/14/14   Jade L Breeback, PA-C  lisinopril-hydrochlorothiazide (PRINZIDE,ZESTORETIC) 20-25 MG per tablet Take 1 tablet by mouth daily. 11/16/14   Jade L Breeback, PA-C   BP 149/88 mmHg  Pulse 84  Temp(Src) 99 F (37.2 C) (Oral)  Resp 16  Ht  (1.651 m)  Wt 224 lb (101.606 kg)  BMI 37.28 kg/m2  SpO2 96% Physical Exam Nursing notes and Vital Signs reviewed. Appearance:  Patient appears stated age, and in no acute distress.  Patient is obese (BMI 37.3) Eyes:  Pupils are equal, round, and reactive to light and accomodation.  Extraocular movement is intact.  Conjunctivae are not inflamed  Ears:  Canals normal.  Tympanic membranes normal.  Nose:  Mildly congested turbinates.  No sinus tenderness.  Pharynx:  Normal Neck:  Supple.  Tender enlarged posterior nodes are palpated bilaterally  Lungs:  Clear to auscultation.  Breath sounds are equal.  Chest:  Distinct tenderness to palpation over the mid-sternum.  Heart:  Regular rate and rhythm without murmurs, rubs, or gallops.  Abdomen:  Nontender without masses or hepatosplenomegaly.  Bowel sounds are present.  No CVA or flank tenderness.  Extremities:  No edema.  No calf tenderness Skin:  No rash present.   ED Course  Procedures  none  MDM   1. Acute bronchitis, unspecified organism    Note persistent fever today. Begin Z-pack for atypical coverage.  Patient is not presently taking  HCTZ (recommend she not take it until finishing azithromycin).  Prescription written for Benzonatate [facility](Tessalon) to take at bedtime for night-time cough.  Take plain guaifenesin (1200mg  extended release tabs such as Mucinex) twice daily, with plenty of water, for cough and congestion.  Get adequate rest. May use Afrin nasal spray (or generic oxymetazoline) twice daily for about 5 days.  Also recommend using saline nasal spray several times daily and saline nasal irrigation (AYR is a common brand).   Try warm salt water gargles for sore throat.  Stop all  antihistamines for now, and other non-prescription cough/cold preparations. May take Ibuprofen 200mg , 4 tabs every 8 hours with food for chest/sternum discomfort.   Follow-up with family doctor if not improving about 7 to10 days.     Lattie HawStephen A Beese, MD 03/16/15 [phone]

## 2015-03-13 ENCOUNTER — Emergency Department (INDEPENDENT_AMBULATORY_CARE_PROVIDER_SITE_OTHER)
Admission: EM | Admit: 2015-03-13 | Discharge: 2015-03-13 | Disposition: A | Discharge status: Home / Self Care | Payer: BLUE CROSS/BLUE SHIELD | Source: Home / Self Care | Attending: Family Medicine | Admitting: Family Medicine

## 2015-03-13 ENCOUNTER — Emergency Department (INDEPENDENT_AMBULATORY_CARE_PROVIDER_SITE_OTHER): Payer: BLUE CROSS/BLUE SHIELD

## 2015-03-13 ENCOUNTER — Encounter: Payer: Self-pay | Admitting: *Deleted

## 2015-03-13 DIAGNOSIS — J189 Pneumonia, unspecified organism: Secondary | ICD-10-CM

## 2015-03-13 LAB — POCT CBC W AUTO DIFF (K'VILLE URGENT CARE)

## 2015-03-13 MED ORDER — PREDNISONE 20 MG PO TABS: 20.0000 mg | ORAL_TABLET | Freq: Two times a day (BID) | ORAL | Status: DC

## 2015-03-13 MED ORDER — LEVOFLOXACIN 500 MG PO TABS: 500.0000 mg | ORAL_TABLET | Freq: Every day | ORAL | Status: DC

## 2015-03-13 MED ORDER — GUAIFENESIN-CODEINE 100-10 MG/5ML PO SOLN: ORAL | Status: DC

## 2015-03-13 NOTE — Discharge Instructions (Signed)
Take plain guaifenesin (1200mg  extended release tabs such as Mucinex) twice daily, with plenty of water, for cough and congestion.   Stop azithromycin Stop all antihistamines for now, and other non-prescription cough/cold preparations.

## 2015-03-13 NOTE — ED Notes (Addendum)
Pt c/o productive cough x 4 days. Denies fever. She started Zpak after her visit on 03/11/15 and she is no better.

## 2015-03-13 NOTE — ED Provider Notes (Signed)
CSN: [id number]     Arrival date & time 03/13/15  1024 History   First MD Initiated Contact with Patient 03/13/15 1218     Chief Complaint  Patient presents with  . Cough      HPI Comments: Patient was evaluated and treated two days ago for bronchitis, prescribed azithromycin.  She reports that she feels no better and has a persistent non-productive cough with pleuritic pain in her anterior/inferior chest bilaterally.  She has occasional wheezing.  The history is provided by the patient.    History reviewed. No pertinent past medical history. History reviewed. No pertinent past surgical history. Family History  Problem Relation Age of Onset  . Heart disease Mother   . Depression Brother    History  Substance Use Topics  . Smoking status: Never Smoker   . Smokeless tobacco: Never Used  . Alcohol Use: No   OB History    No data available     Review of Systems + sore throat + cough + pleuritic pain + wheezing + nasal congestion + post-nasal drainage No sinus pain/pressure No itchy/red eyes No earache No hemoptysis No SOB No fever, + chills No nausea No vomiting No abdominal pain No diarrhea No urinary symptoms No skin rash + fatigue No myalgias + headache Used OTC meds without relief  Allergies  Review of patient's allergies indicates no known allergies.  Home Medications   Prior to Admission medications   Medication Sig Start Date End Date Taking? Authorizing Provider  amLODipine (NORVASC) 5 MG tablet Take 1 tablet (5 mg total) by mouth daily. 11/16/14   Jade L Breeback, PA-C  azithromycin (ZITHROMAX Z-PAK) 250 MG tablet Take 2 tabs today; then begin one tab once daily for 4 more days. 03/11/15   Lattie HawStephen A Haley Fuerstenberg, MD  benzonatate (TESSALON) 200 MG capsule Take 1 capsule (200 mg total) by mouth at bedtime. Take as needed for cough 03/11/15   Lattie HawStephen A Stephaney Steven, MD  guaiFENesin-codeine 100-10 MG/5ML syrup Take 10mL by mouth at bedtime as needed for cough 03/13/15    Lattie HawStephen A Piya Mesch, MD  HYDROcodone-acetaminophen (NORCO/VICODIN) 5-325 MG per tablet Take 1 tablet by mouth every 8 (eight) hours as needed for moderate pain. 06/14/14   Jade L Breeback, PA-C  levofloxacin (LEVAQUIN) 500 MG tablet Take 1 tablet (500 mg total) by mouth daily. 03/13/15   Lattie HawStephen A Kass Herberger, MD  lisinopril-hydrochlorothiazide (PRINZIDE,ZESTORETIC) 20-25 MG per tablet Take 1 tablet by mouth daily. 11/16/14   Jade L Breeback, PA-C  predniSONE (DELTASONE) 20 MG tablet Take 1 tablet (20 mg total) by mouth 2 (two) times daily. Take with food. 03/13/15   Lattie HawStephen A Monserrat Vidaurri, MD   BP 138/84 mmHg  Pulse 78  Temp(Src) 98.2 F (36.8 C) (Oral)  Resp 18  Ht 5\' 5"  (1.651 m)  Wt 224 lb (101.606 kg)  BMI 37.28 kg/m2  SpO2 96% Physical Exam  Constitutional: She is oriented to person, place, and time. She appears well-developed and well-nourished. No distress.  HENT:  Head: Normocephalic.  Nose: Nose normal.  Mouth/Throat: Oropharynx is clear and moist.  Eyes: Conjunctivae are normal. Pupils are equal, round, and reactive to light. Right eye exhibits no discharge. Left eye exhibits no discharge.  Neck: Neck supple.  Cardiovascular: Normal heart sounds.   Pulmonary/Chest: Breath sounds normal. No respiratory distress. She has no wheezes. She has no rales. She exhibits no tenderness.    Patient has tenderness to palpation in areas noted on diagram.  Abdominal: There is no  tenderness.  Musculoskeletal: She exhibits no edema.  Lymphadenopathy:    She has no cervical adenopathy.  Neurological: She is alert and oriented to person, place, and time.  Skin: Skin is warm and dry. No rash noted.  Nursing note and vitals reviewed.   ED Course  Procedures  None   Labs Reviewed  POCT CBC W AUTO DIFF ([facility]):  WBC 9.2; LY 35.2; MO 3.2; GR 61.6; Hgb 13.3; Platelets 353     Imaging Review Dg Chest 2 View  03/13/2015   CLINICAL DATA:  Cough, fever and congestion for 5 days.  EXAM: CHEST  2  VIEW  COMPARISON:  PA and lateral chest 06/22/2008.  FINDINGS: Airspace disease in the right lower lung zone is seen on the frontal view only and worrisome for pneumonia. The left lung appears clear. No pneumothorax or pleural effusion. Heart size is normal.  IMPRESSION: Findings worrisome for pneumonia in the right lower lung zone.   Electronically Signed   By: Drusilla Kanner M.D.   On: 03/13/2015 13:18     MDM   1. Pneumonia, organism unspecified    Begin Levaquin  daily for one week.  Rx for Robitussin AC for night time cough.  Prednisone burst. Take plain guaifenesin (  extended release tabs such as Mucinex) twice daily, with plenty of water, for cough and congestion.   Stop azithromycin Stop all antihistamines for now, and other non-prescription cough/cold preparations. Followup with Family Doctor in one week    Lattie Haw, MD 03/17/15 [phone]

## 2015-03-16 ENCOUNTER — Telehealth: Payer: Self-pay | Admitting: *Deleted

## 2015-03-21 ENCOUNTER — Ambulatory Visit (INDEPENDENT_AMBULATORY_CARE_PROVIDER_SITE_OTHER): Payer: BLUE CROSS/BLUE SHIELD | Admitting: Physician Assistant

## 2015-03-21 ENCOUNTER — Encounter: Payer: Self-pay | Admitting: Physician Assistant

## 2015-03-21 VITALS — BP 153/88 | HR 89 | Ht 65.0 in | Wt 227.0 lb

## 2015-03-21 DIAGNOSIS — I10 Essential (primary) hypertension: Secondary | ICD-10-CM

## 2015-03-21 DIAGNOSIS — R635 Abnormal weight gain: Secondary | ICD-10-CM | POA: Diagnosis not present

## 2015-03-21 DIAGNOSIS — E669 Obesity, unspecified: Secondary | ICD-10-CM | POA: Diagnosis not present

## 2015-03-21 MED ORDER — LORCASERIN HCL 10 MG PO TABS: 1.0000 | ORAL_TABLET | Freq: Two times a day (BID) | ORAL | Status: DC

## 2015-03-21 MED ORDER — AMLODIPINE BESYLATE 5 MG PO TABS: 5.0000 mg | ORAL_TABLET | Freq: Every day | ORAL | Status: DC

## 2015-03-21 MED ORDER — LISINOPRIL-HYDROCHLOROTHIAZIDE 20-25 MG PO TABS: 1.0000 | ORAL_TABLET | Freq: Every day | ORAL | Status: DC

## 2015-03-21 NOTE — Progress Notes (Signed)
   Subjective:    Patient ID: Debbora PrestoCathy Dileo, female    DOB: 02/09/1966, 49 y.o.   MRN: [id number]  HPI  Pt presents to the clinic to discuss weight gain. She has been on and off phentermine with no real weight loss. She did try belviq at one point but did not give it a good go per pt. She does try to walk on treadmil 30 minutes a day but as far as food "she eats whatever she wants". She is frustrated and wants to lose weight.   HTN- did not take medication this morning. States she "takes most mornings" but she laughed. No CP, palpitations, headaches or vision changes.      Review of Systems  All other systems reviewed and are negative.      Objective:   Physical Exam  Constitutional: She is oriented to person, place, and time. She appears well-developed and well-nourished.  Obese.   HENT:  Head: Normocephalic and atraumatic.  Cardiovascular: Normal rate, regular rhythm and normal heart sounds.   Pulmonary/Chest: Effort normal and breath sounds normal.  Neurological: She is alert and oriented to person, place, and time.  Psychiatric: She has a normal mood and affect. Her behavior is normal.          Assessment & Plan:  HTN- discussed BP uncontrolled today. She must take her medications daily and especially before a visit. Refilled today. Discussed to come in next 2 weeks for nurse visit to make sure controlled.   Obesity/abnormal weight gain- not a candidate for phentermine. She tried and failed multiple times and BP not controlled. Discussed belviq, contrave, qsymia and saxenda. She wants to try belviq again. Given rx for 3 months. Discussed side effects and how to take. Discussed importance of diet. offerred nutrition referral. Pt declined due to time.

## 2015-06-06 ENCOUNTER — Emergency Department (INDEPENDENT_AMBULATORY_CARE_PROVIDER_SITE_OTHER)
Admission: EM | Admit: 2015-06-06 | Discharge: 2015-06-06 | Disposition: A | Discharge status: Home / Self Care | Payer: BLUE CROSS/BLUE SHIELD | Source: Home / Self Care | Attending: Family Medicine | Admitting: Family Medicine

## 2015-06-06 ENCOUNTER — Encounter: Payer: Self-pay | Admitting: *Deleted

## 2015-06-06 DIAGNOSIS — M7662 Achilles tendinitis, left leg: Secondary | ICD-10-CM

## 2015-06-06 DIAGNOSIS — M7661 Achilles tendinitis, right leg: Secondary | ICD-10-CM | POA: Diagnosis not present

## 2015-06-06 DIAGNOSIS — S86891A Other injury of other muscle(s) and tendon(s) at lower leg level, right leg, initial encounter: Secondary | ICD-10-CM | POA: Diagnosis not present

## 2015-06-06 DIAGNOSIS — S86892A Other injury of other muscle(s) and tendon(s) at lower leg level, left leg, initial encounter: Secondary | ICD-10-CM

## 2015-06-06 DIAGNOSIS — M179 Osteoarthritis of knee, unspecified: Secondary | ICD-10-CM | POA: Diagnosis not present

## 2015-06-06 DIAGNOSIS — M1711 Unilateral primary osteoarthritis, right knee: Secondary | ICD-10-CM

## 2015-06-06 HISTORY — DX: Essential (primary) hypertension: I10

## 2015-06-06 MED ORDER — CYCLOBENZAPRINE HCL 10 MG PO TABS: ORAL_TABLET | ORAL | Status: DC

## 2015-06-06 MED ORDER — MELOXICAM 15 MG PO TABS: 15.0000 mg | ORAL_TABLET | Freq: Every day | ORAL | Status: DC

## 2015-06-06 NOTE — ED Notes (Signed)
Pt c/o bilateral ankle swelling x 3 wks. Denies pain.

## 2015-06-06 NOTE — ED Provider Notes (Signed)
CSN: [id number]     Arrival date & time 06/06/15  1738 History   First MD Initiated Contact with Patient 06/06/15 1751     Chief Complaint  Patient presents with  . Leg Swelling      HPI Comments: Patient has increased her walking recently.  She now complains of increased swelling in both her ankles, and soreness in her posterior calves, heels, and her right knee.  She recalls no injuries.  The history is provided by the patient.    Past Medical History  Diagnosis Date  . Hypertension    History reviewed. No pertinent past surgical history. Family History  Problem Relation Age of Onset  . Heart disease Mother   . Depression Brother    History  Substance Use Topics  . Smoking status: Never Smoker   . Smokeless tobacco: Never Used  . Alcohol Use: No   OB History    No data available     Review of Systems  Constitutional: Negative.   HENT: Negative.   Eyes: Negative.   Respiratory: Negative.   Cardiovascular: Negative.   Gastrointestinal: Negative.   Genitourinary: Negative.   Musculoskeletal: Positive for joint swelling.       Bilateral posterior ankle pain  Skin: Negative.   Neurological: Negative for numbness.    Allergies  Review of patient's allergies indicates no known allergies.  Home Medications   Prior to Admission medications   Medication Sig Start Date End Date Taking? Authorizing Provider  amLODipine (NORVASC) 5 MG tablet Take 1 tablet (5 mg total) by mouth daily. 03/21/15  Yes Jade L Breeback, PA-C  lisinopril-hydrochlorothiazide (PRINZIDE,ZESTORETIC) 20-25 MG per tablet Take 1 tablet by mouth daily. 03/21/15  Yes Jade L Breeback, PA-C  cyclobenzaprine (FLEXERIL) 10 MG tablet Take one tab by mouth at bedtime for muscle spasm 06/06/15   Lattie HawStephen A Tavie Haseman, MD  HYDROcodone-acetaminophen (NORCO/VICODIN) 5-325 MG per tablet Take 1 tablet by mouth every 8 (eight) hours as needed for moderate pain. 06/14/14   Jade L Breeback, PA-C  Lorcaserin HCl 10 MG TABS Take 1  tablet by mouth 2 (two) times daily. 03/21/15   Jade L Breeback, PA-C  Lorcaserin HCl 10 MG TABS Take 1 tablet by mouth 2 (two) times daily. 03/21/15   Jade L Breeback, PA-C  meloxicam (MOBIC) 15 MG tablet Take 1 tablet (15 mg total) by mouth daily. Take with food each morning 06/06/15   Lattie HawStephen A Temple Ewart, MD   BP 154/98 mmHg  Pulse 78  Temp(Src) 98.5 F (36.9 C) (Oral)  Resp 18  Ht 5\' 5"  (1.651 m)  Wt 229 lb (103.874 kg)  BMI 38.11 kg/m2  SpO2 96% Physical Exam  Constitutional: She is oriented to person, place, and time. She appears well-developed and well-nourished. No distress.  Patient is obese (BMI 38.11)  HENT:  Head: Normocephalic.  Eyes: Pupils are equal, round, and reactive to light.  Cardiovascular: Normal heart sounds.   Pulmonary/Chest: Breath sounds normal.  Abdominal: There is no tenderness.  Musculoskeletal:       Right knee: She exhibits normal range of motion, no swelling, no effusion, no ecchymosis, no deformity, no erythema, normal alignment, no LCL laxity, normal patellar mobility, normal meniscus and no MCL laxity. Tenderness found. Medial joint line and lateral joint line tenderness noted. No MCL, no LCL and no patellar tendon tenderness noted.       Right ankle: She exhibits normal range of motion, no swelling, no ecchymosis, no deformity, no laceration and normal pulse.  No tenderness. Achilles tendon exhibits pain.       Left ankle: She exhibits normal range of motion, no swelling, no ecchymosis, no deformity, no laceration and normal pulse. No lateral malleolus tenderness found. Achilles tendon exhibits pain.       Right lower leg: She exhibits tenderness. She exhibits no bony tenderness, no swelling, no edema, no deformity and no laceration.       Legs: Patient has distinct tenderness to palpation over both achilles tendons at insertions onto calcaneus.    There is tenderness to palpation over anterior compartments of both lower legs with resisted dorsiflexion of the  feet.  No swelling, erythema, or warmth, and no calf tenderness.  Right knee has good range of motion. There is tenderness over the joint lines.  Negative McMurray test.                                                                                                                                                                                               Lymphadenopathy:    She has no cervical adenopathy.  Neurological: She is alert and oriented to person, place, and time.  Skin: Skin is warm and dry.  Nursing note and vitals reviewed.   ED Course  Procedures  none   MDM   1. Shin splints, left, initial encounter   2. Shin splints, right, initial encounter   3. Achilles tendonitis, bilateral   4. Osteoarthritis of right knee, unspecified osteoarthritis type    Begin Mobic  daily, and Flexeril  HS Apply ice pack for 20 to 30 minutes, 3 to 4 times daily  Continue until pain decreases.  Begin stretching and range of motion exercises as tolerated. Followup with Dr. Rodney Langton or Dr. Clementeen Graham ([facility]) if not improving about two weeks.       Lattie Haw, MD 06/11/15 [phone]

## 2015-06-06 NOTE — Discharge Instructions (Signed)
Apply ice pack for 20 to 30 minutes, 3 to 4 times daily  Continue until pain decreases.  Begin stretching and range of motion exercises as tolerated.   Achilles Tendinitis Achilles tendinitis is inflammation of the tough, cord-like band that attaches the lower muscles of your leg to your heel (Achilles tendon). It is usually caused by overusing the tendon and joint involved.  CAUSES Achilles tendinitis can happen because of:  A sudden increase in exercise or activity (such as running).  Doing the same exercises or activities (such as jumping) over and over.  Not warming up calf muscles before exercising.  Exercising in shoes that are worn out or not made for exercise.  Having arthritis or a bone growth on the back of the heel bone. This can rub against the tendon and hurt the tendon. SIGNS AND SYMPTOMS The most common symptoms are:  Pain in the back of the leg, just above the heel. The pain usually gets worse with exercise and better with rest.  Stiffness or soreness in the back of the leg, especially in the morning.  Swelling of the skin over the Achilles tendon.  Trouble standing on tiptoe. Sometimes, an Achilles tendon tears (ruptures). Symptoms of an Achilles tendon rupture can include:  Sudden, severe pain in the back of the leg.  Trouble putting weight on the foot or walking normally. DIAGNOSIS Achilles tendinitis will be diagnosed based on symptoms and a physical examination. An X-ray may be done to check if another condition is causing your symptoms. An MRI may be ordered if your health care provider suspects you may have completely torn your tendon, which is called an Achilles tendon rupture.  TREATMENT  Achilles tendinitis usually gets better over time. It can take weeks to months to heal completely. Treatment focuses on treating the symptoms and helping the injury heal. HOME CARE INSTRUCTIONS   Rest your Achilles tendon and avoid activities that cause pain.  Apply  ice to the injured area:  Put ice in a plastic bag.  Place a towel between your skin and the bag.  Leave the ice on for 20 minutes, 2-3 times a day  Try to avoid using the tendon (other than gentle range of motion) while the tendon is painful. Do not resume use until instructed by your health care provider. Then begin use gradually. Do not increase use to the point of pain. If pain does develop, decrease use and continue the above measures. Gradually increase activities that do not cause discomfort until you achieve normal use.  Do exercises to make your calf muscles stronger and more flexible. Your health care provider or physical therapist can recommend exercises for you to do.  Wrap your ankle with an elastic bandage or other wrap. This can help keep your tendon from moving too much. Your health care provider will show you how to wrap your ankle correctly.  Only take over-the-counter or prescription medicines for pain, discomfort, or fever as directed by your health care provider. SEEK MEDICAL CARE IF:   Your pain and swelling increase or pain is uncontrolled with medicines.  You develop new, unexplained symptoms or your symptoms get worse.  You are unable to move your toes or foot.  You develop warmth and swelling in your foot.  You have an unexplained temperature. MAKE SURE YOU:   Understand these instructions.  Will watch your condition.  Will get help right away if you are not doing well or get worse. Document Released: 08/14/2005 Document Revised:  08/25/2013 Document Reviewed: 06/16/2013 [employer] Patient Information 2015 [place], [place]. This information is not intended to replace advice given to you by your health care provider. Make sure you discuss any questions you have with your health care provider.   Shin Splints Shin splints is a painful condition that is felt on the shinbone or in the muscles on either side of the bone (front of your lower leg). Shin splints  happen when physical activities, such as sports or other demanding exercise, leads to inflammation of the muscles, tendons, and the thin layer that covers the shinbone.  CAUSES   Overuse of muscles.  Repetitive activities.  Flat feet or rigid arches. Activities that could contribute to shin splints include:  A sudden increase in exercise time.  Starting a new, demanding activity.  Running up hills or long distances.  Playing sports with sudden starts and stops.  A poor warm up.  Old or worn-out shoes. SYMPTOMS   Pain on the front of the leg.  Pain while exercising or at rest. DIAGNOSIS  Your caregiver will diagnose shin splints from a history of your symptoms and a physical exam. You may be observed as you walk or run. X-ray exams or further testing may be needed to rule out other problems, such as a stress fracture, which also causes lower leg pain. TREATMENT  Your caregiver may decide on the treatment based on your age, history, health, and how bad the pain is. Most cases of shin splints can be managed by one or more of the following:  Resting.  Reducing the length and intensity of your exercise.  Stopping the activity that causes shin pain.  Taking medicines to control the inflammation.  Icing, massaging, stretching, and strengthening the affected area.  Getting shoes with rigid heels, shock absorption, and a good arch support. HOME CARE INSTRUCTIONS   Resume activity steadily or as directed by your caregiver.  Restart your exercise sessions with non-weight-bearing exercises, such as cycling or swimming.  Stop running if the pain returns.  Warm up properly before exercising.  Run on a level and fairly firm surface.  Gradually change the intensity of an exercise.  Limit increases in running distance by no more than 5 to 10% weekly. This means if you are running 5 miles, you can only increase your run by 1/2 a mile at a time.  Change your athletic shoes  every 6 months, or every 350 to 450 miles. SEEK MEDICAL CARE IF:   Symptoms continue or worsen even after treatment.  The location, intensity, or type of pain changes over time. SEEK IMMEDIATE MEDICAL CARE IF:   You have severe pain.  You have trouble walking. MAKE SURE YOU:  Understand these instructions.  Will watch your condition.  Will get help right away if you are not doing well or get worse. Document Released: 11/01/2000 Document Revised: 01/27/2012 Document Reviewed: 04/21/2011 [facility] Patient Information 2015 [place], [place]. This information is not intended to replace advice given to you by your health care provider. Make sure you discuss any questions you have with your health care provider.   Knee Pain The knee is the complex joint between your thigh and your lower leg. It is made up of bones, tendons, ligaments, and cartilage. The bones that make up the knee are:  The femur in the thigh.  The tibia and fibula in the lower leg.  The patella or kneecap riding in the groove on the lower femur. CAUSES  Knee pain is a  common complaint with many causes. A few of these causes are:  Injury, such as:  A ruptured ligament or tendon injury.  Torn cartilage.  Medical conditions, such as:  Gout  Arthritis  Infections  Overuse, over training, or overdoing a physical activity. Knee pain can be minor or severe. Knee pain can accompany debilitating injury. Minor knee problems often respond well to self-care measures or get well on their own. More serious injuries may need medical intervention or even surgery. SYMPTOMS The knee is complex. Symptoms of knee problems can vary widely. Some of the problems are:  Pain with movement and weight bearing.  Swelling and tenderness.  Buckling of the knee.  Inability to straighten or extend your knee.  Your knee locks and you cannot straighten it.  Warmth and redness with pain and fever.  Deformity or dislocation of  the kneecap. DIAGNOSIS  Determining what is wrong may be very straight forward such as when there is an injury. It can also be challenging because of the complexity of the knee. Tests to make a diagnosis may include:  Your caregiver taking a history and doing a physical exam.  Routine X-rays can be used to rule out other problems. X-rays will not reveal a cartilage tear. Some injuries of the knee can be diagnosed by:  Arthroscopy a surgical technique by which a small video camera is inserted through tiny incisions on the sides of the knee. This procedure is used to examine and repair internal knee joint problems. Tiny instruments can be used during arthroscopy to repair the torn knee cartilage (meniscus).  Arthrography is a radiology technique. A contrast liquid is directly injected into the knee joint. Internal structures of the knee joint then become visible on X-ray film.  An MRI scan is a non X-ray radiology procedure in which magnetic fields and a computer produce two- or three-dimensional images of the inside of the knee. Cartilage tears are often visible using an MRI scanner. MRI scans have largely replaced arthrography in diagnosing cartilage tears of the knee.  Blood work.  Examination of the fluid that helps to lubricate the knee joint (synovial fluid). This is done by taking a sample out using a needle and a syringe. TREATMENT The treatment of knee problems depends on the cause. Some of these treatments are:  Depending on the injury, proper casting, splinting, surgery, or physical therapy care will be needed.  Give yourself adequate recovery time. Do not overuse your joints. If you begin to get sore during workout routines, back off. Slow down or do fewer repetitions.  For repetitive activities such as cycling or running, maintain your strength and nutrition.  Alternate muscle groups. For example, if you are a weight lifter, work the upper body on one day and the lower body the  next.  Either tight or weak muscles do not give the proper support for your knee. Tight or weak muscles do not absorb the stress placed on the knee joint. Keep the muscles surrounding the knee strong.  Take care of mechanical problems.  If you have flat feet, orthotics or special shoes may help. See your caregiver if you need help.  Arch supports, sometimes with wedges on the inner or outer aspect of the heel, can help. These can shift pressure away from the side of the knee most bothered by osteoarthritis.  A brace called an "unloader" brace also may be used to help ease the pressure on the most arthritic side of the knee.  If your  caregiver has prescribed crutches, braces, wraps or ice, use as directed. The acronym for this is PRICE. This means protection, rest, ice, compression, and elevation.  Nonsteroidal anti-inflammatory drugs (NSAIDs), can help relieve pain. But if taken immediately after an injury, they may actually increase swelling. Take NSAIDs with food in your stomach. Stop them if you develop stomach problems. Do not take these if you have a history of ulcers, stomach pain, or bleeding from the bowel. Do not take without your caregiver's approval if you have problems with fluid retention, heart failure, or kidney problems.  For ongoing knee problems, physical therapy may be helpful.  Glucosamine and chondroitin are over-the-counter dietary supplements. Both may help relieve the pain of osteoarthritis in the knee. These medicines are different from the usual anti-inflammatory drugs. Glucosamine may decrease the rate of cartilage destruction.  Injections of a corticosteroid drug into your knee joint may help reduce the symptoms of an arthritis flare-up. They may provide pain relief that lasts a few months. You may have to wait a few months between injections. The injections do have a small increased risk of infection, water retention, and elevated blood sugar levels.  Hyaluronic  acid injected into damaged joints may ease pain and provide lubrication. These injections may work by reducing inflammation. A series of shots may give relief for as long as 6 months.  Topical painkillers. Applying certain ointments to your skin may help relieve the pain and stiffness of osteoarthritis. Ask your pharmacist for suggestions. Many over the-counter products are approved for temporary relief of arthritis pain.  In some countries, doctors often prescribe topical NSAIDs for relief of chronic conditions such as arthritis and tendinitis. A review of treatment with NSAID creams found that they worked as well as oral medications but without the serious side effects. PREVENTION  Maintain a healthy weight. Extra pounds put more strain on your joints.  Get strong, stay limber. Weak muscles are a common cause of knee injuries. Stretching is important. Include flexibility exercises in your workouts.  Be smart about exercise. If you have osteoarthritis, chronic knee pain or recurring injuries, you may need to change the way you exercise. This does not mean you have to stop being active. If your knees ache after jogging or playing basketball, consider switching to swimming, water aerobics, or other low-impact activities, at least for a few days a week. Sometimes limiting high-impact activities will provide relief.  Make sure your shoes fit well. Choose footwear that is right for your sport.  Protect your knees. Use the proper gear for knee-sensitive activities. Use kneepads when playing volleyball or laying carpet. Buckle your seat belt every time you drive. Most shattered kneecaps occur in car accidents.  Rest when you are tired. SEEK MEDICAL CARE IF:  You have knee pain that is continual and does not seem to be getting better.  SEEK IMMEDIATE MEDICAL CARE IF:  Your knee joint feels hot to the touch and you have a high fever. MAKE SURE YOU:   Understand these instructions.  Will watch your  condition.  Will get help right away if you are not doing well or get worse. Document Released: 09/01/2007 Document Revised: 01/27/2012 Document Reviewed: 09/01/2007 [facility] Patient Information 2015 [place], [place]. This information is not intended to replace advice given to you by your health care provider. Make sure you discuss any questions you have with your health care provider.

## 2015-06-20 ENCOUNTER — Ambulatory Visit: Payer: BLUE CROSS/BLUE SHIELD | Admitting: Family Medicine

## 2015-06-22 ENCOUNTER — Ambulatory Visit (INDEPENDENT_AMBULATORY_CARE_PROVIDER_SITE_OTHER): Payer: BLUE CROSS/BLUE SHIELD | Admitting: Family Medicine

## 2015-06-22 ENCOUNTER — Ambulatory Visit: Payer: BLUE CROSS/BLUE SHIELD | Admitting: Family Medicine

## 2015-06-22 ENCOUNTER — Encounter: Payer: Self-pay | Admitting: Family Medicine

## 2015-06-22 VITALS — BP 132/81 | HR 83 | Ht 65.0 in | Wt 229.0 lb

## 2015-06-22 DIAGNOSIS — R635 Abnormal weight gain: Secondary | ICD-10-CM

## 2015-06-22 MED ORDER — PHENTERMINE HCL 37.5 MG PO TABS: 37.5000 mg | ORAL_TABLET | Freq: Every day | ORAL | Status: DC

## 2015-06-22 NOTE — Assessment & Plan Note (Signed)
Start phentermine return in one month for weight and blood pressure recheck

## 2015-06-22 NOTE — Patient Instructions (Signed)
Thank you for coming in today. Take phenetamine.,  Return in 1 month for blood pressure and weight check.  Use MyFitnessPal application.   Calorie Counting for Weight Loss Calories are energy you get from the things you eat and drink. Your body uses this energy to keep you going throughout the day. The number of calories you eat affects your weight. When you eat more calories than your body needs, your body stores the extra calories as fat. When you eat fewer calories than your body needs, your body burns fat to get the energy it needs. Calorie counting means keeping track of how many calories you eat and drink each day. If you make sure to eat fewer calories than your body needs, you should lose weight. In order for calorie counting to work, you will need to eat the number of calories that are right for you in a day to lose a healthy amount of weight per week. A healthy amount of weight to lose per week is usually 1-2 lb (0.5-0.9 kg). A dietitian can determine how many calories you need in a day and give you suggestions on how to reach your calorie goal.  WHAT IS MY MY PLAN? My goal is to have __________ calories per day.  If I have this many calories per day, I should lose around __________ pounds per week. WHAT DO I NEED TO KNOW ABOUT CALORIE COUNTING? In order to meet your daily calorie goal, you will need to:  Find out how many calories are in each food you would like to eat. Try to do this before you eat.  Decide how much of the food you can eat.  Write down what you ate and how many calories it had. Doing this is called keeping a food log. WHERE DO I FIND CALORIE INFORMATION? The number of calories in a food can be found on a Nutrition Facts label. Note that all the information on a label is based on a specific serving of the food. If a food does not have a Nutrition Facts label, try to look up the calories online or ask your dietitian for help. HOW DO I DECIDE HOW MUCH TO EAT? To decide  how much of the food you can eat, you will need to consider both the number of calories in one serving and the size of one serving. This information can be found on the Nutrition Facts label. If a food does not have a Nutrition Facts label, look up the information online or ask your dietitian for help. Remember that calories are listed per serving. If you choose to have more than one serving of a food, you will have to multiply the calories per serving by the amount of servings you plan to eat. For example, the label on a package of bread might say that a serving size is 1 slice and that there are 90 calories in a serving. If you eat 1 slice, you will have eaten 90 calories. If you eat 2 slices, you will have eaten 180 calories. HOW DO I KEEP A FOOD LOG? After each meal, record the following information in your food log:  What you ate.  How much of it you ate.  How many calories it had.  Then, add up your calories. Keep your food log near you, such as in a small notebook in your pocket. Another option is to use a mobile app or website. Some programs will calculate calories for you and show you how many  calories you have left each time you add an item to the log. WHAT ARE SOME CALORIE COUNTING TIPS?  Use your calories on foods and drinks that will fill you up and not leave you hungry. Some examples of this include foods like nuts and nut butters, vegetables, lean proteins, and high-fiber foods (more than 5 g fiber per serving).  Eat nutritious foods and avoid empty calories. Empty calories are calories you get from foods or beverages that do not have many nutrients, such as candy and soda. It is better to have a nutritious high-calorie food (such as an avocado) than a food with few nutrients (such as a bag of chips).  Know how many calories are in the foods you eat most often. This way, you do not have to look up how many calories they have each time you eat them.  Look out for foods that may  seem like low-calorie foods but are really high-calorie foods, such as baked goods, soda, and fat-free candy.  Pay attention to calories in drinks. Drinks such as sodas, specialty coffee drinks, alcohol, and juices have a lot of calories yet do not fill you up. Choose low-calorie drinks like water and diet drinks.  Focus your calorie counting efforts on higher calorie items. Logging the calories in a garden salad that contains only vegetables is less important than calculating the calories in a milk shake.  Find a way of tracking calories that works for you. Get creative. Most people who are successful find ways to keep track of how much they eat in a day, even if they do not count every calorie. WHAT ARE SOME PORTION CONTROL TIPS?  Know how many calories are in a serving. This will help you know how many servings of a certain food you can have.  Use a measuring cup to measure serving sizes. This is helpful when you start out. With time, you will be able to estimate serving sizes for some foods.  Take some time to put servings of different foods on your favorite plates, bowls, and cups so you know what a serving looks like.  Try not to eat straight from a bag or box. Doing this can lead to overeating. Put the amount you would like to eat in a cup or on a plate to make sure you are eating the right portion.  Use smaller plates, glasses, and bowls to prevent overeating. This is a quick and easy way to practice portion control. If your plate is smaller, less food can fit on it.  Try not to multitask while eating, such as watching TV or using your computer. If it is time to eat, sit down at a table and enjoy your food. Doing this will help you to start recognizing when you are full. It will also make you more aware of what and how much you are eating. HOW CAN I CALORIE COUNT WHEN EATING OUT?  Ask for smaller portion sizes or child-sized portions.  Consider sharing an entree and sides instead of  getting your own entree.  If you get your own entree, eat only half. Ask for a box at the beginning of your meal and put the rest of your entree in it so you are not tempted to eat it.  Look for the calories on the menu. If calories are listed, choose the lower calorie options.  Choose dishes that include vegetables, fruits, whole grains, low-fat dairy products, and lean protein. Focusing on smart food choices from each  of the 5 food groups can help you stay on track at restaurants.  Choose items that are boiled, broiled, grilled, or steamed.  Choose water, milk, unsweetened iced tea, or other drinks without added sugars. If you want an alcoholic beverage, choose a lower calorie option. For example, a regular margarita can have up to 700 calories and a glass of wine has around 150.  Stay away from items that are buttered, battered, fried, or served with cream sauce. Items labeled "crispy" are usually fried, unless stated otherwise.  Ask for dressings, sauces, and syrups on the side. These are usually very high in calories, so do not eat much of them.  Watch out for salads. Many people think salads are a healthy option, but this is often not the case. Many salads come with bacon, fried chicken, lots of cheese, fried chips, and dressing. All of these items have a lot of calories. If you want a salad, choose a garden salad and ask for grilled meats or steak. Ask for the dressing on the side, or ask for olive oil and vinegar or lemon to use as dressing.  Estimate how many servings of a food you are given. For example, a serving of cooked rice is  cup or about the size of half a tennis ball or one cupcake wrapper. Knowing serving sizes will help you be aware of how much food you are eating at restaurants. The list below tells you how big or small some common portion sizes are based on everyday objects.  1 oz--4 stacked dice.  3 oz--1 deck of cards.  1 tsp--1 dice.  1 Tbsp-- a Ping-Pong  ball.  2 Tbsp--1 Ping-Pong ball.   cup--1 tennis ball or 1 cupcake wrapper.  1 cup--1 baseball. Document Released: 11/04/2005 Document Revised: 03/21/2014 Document Reviewed: 09/09/2013 [facility] Patient Information 2015 [place], [place]. This information is not intended to replace advice given to you by your health care provider. Make sure you discuss any questions you have with your health care provider.

## 2015-06-22 NOTE — Progress Notes (Signed)
Tammy Reed is a 49 y.o. female who presents to [facility]  today for weight loss management. Patient returns to clinic to discuss weight loss. She exercises 3 times a week and has not yet started calorie restriction. Patient will eventually but had to stop because she had poorly controlled blood pressure. She takes her blood pressure medicine now. No chest pains palpitations or shortness of breath.   Past Medical History  Diagnosis Date  . Hypertension    No past surgical history on file. History  Substance Use Topics  . Smoking status: Never Smoker   . Smokeless tobacco: Never Used  . Alcohol Use: No   ROS as above Medications: Current Outpatient Prescriptions  Medication Sig Dispense Refill  . amLODipine (NORVASC) 5 MG tablet Take 1 tablet (5 mg total) by mouth daily. 90 tablet 1  . lisinopril-hydrochlorothiazide (PRINZIDE,ZESTORETIC) 20-25 MG per tablet Take 1 tablet by mouth daily. 90 tablet 1  . phentermine (ADIPEX-P) 37.5 MG tablet Take 1 tablet (37.5 mg total) by mouth daily before breakfast. 30 tablet 0   No current facility-administered medications for this visit.   No Known Allergies   Exam:  BP 132/81 mmHg  Pulse 83  Ht  (1.651 m)  Wt 229 lb (103.874 kg)  BMI 38.11 kg/m2 Gen: Well NAD HEENT: EOMI,  MMM Lungs: Normal work of breathing. CTABL Heart: RRR no MRG Abd: NABS, Soft. Nondistended, Nontender Exts: Brisk capillary refill, warm and well perfused.   No results found for this or any previous visit (from the past 24 hour(s)). No results found.   Please see individual assessment and plan sections.

## 2015-08-08 ENCOUNTER — Ambulatory Visit: Payer: BLUE CROSS/BLUE SHIELD | Admitting: Family Medicine

## 2015-08-10 ENCOUNTER — Ambulatory Visit: Payer: BLUE CROSS/BLUE SHIELD | Admitting: Family Medicine

## 2015-08-11 ENCOUNTER — Ambulatory Visit: Payer: BLUE CROSS/BLUE SHIELD | Admitting: Physician Assistant

## 2015-08-14 ENCOUNTER — Encounter: Payer: Self-pay | Admitting: Physician Assistant

## 2015-08-14 ENCOUNTER — Ambulatory Visit (INDEPENDENT_AMBULATORY_CARE_PROVIDER_SITE_OTHER): Payer: BLUE CROSS/BLUE SHIELD | Admitting: Physician Assistant

## 2015-08-14 VITALS — BP 135/90 | HR 86 | Ht 65.0 in | Wt 228.0 lb

## 2015-08-14 DIAGNOSIS — R635 Abnormal weight gain: Secondary | ICD-10-CM | POA: Diagnosis not present

## 2015-08-14 DIAGNOSIS — E6609 Other obesity due to excess calories: Secondary | ICD-10-CM | POA: Insufficient documentation

## 2015-08-14 DIAGNOSIS — I10 Essential (primary) hypertension: Secondary | ICD-10-CM | POA: Diagnosis not present

## 2015-08-14 DIAGNOSIS — Z566 Other physical and mental strain related to work: Secondary | ICD-10-CM

## 2015-08-14 DIAGNOSIS — E669 Obesity, unspecified: Secondary | ICD-10-CM | POA: Diagnosis not present

## 2015-08-14 DIAGNOSIS — Z6834 Body mass index (BMI) 34.0-34.9, adult: Secondary | ICD-10-CM | POA: Insufficient documentation

## 2015-08-14 DIAGNOSIS — E66812 Other obesity due to excess calories: Secondary | ICD-10-CM | POA: Insufficient documentation

## 2015-08-14 MED ORDER — BUPROPION HCL ER (XL) 150 MG PO TB24: 150.0000 mg | ORAL_TABLET | Freq: Every day | ORAL | Status: DC

## 2015-08-14 MED ORDER — PHENTERMINE HCL 37.5 MG PO TABS: 37.5000 mg | ORAL_TABLET | Freq: Every day | ORAL | Status: DC

## 2015-08-14 NOTE — Progress Notes (Signed)
   Subjective:    Patient ID: Tammy Reed, female    DOB: [birth year], 49 y.o.   MRN: [id number]  HPI  Patient is a 49 year old female who presents to the clinic to discuss and follow-up on weight loss. She continues to exercise 3 times a week. She is started back on phentermine. She is down 1 pound. She does feel like she is losing inches. She denies any side effects from the medication. She denies any insomnia or palpitations. She does talk about some excessive stress at work. She feels like the stress is inhibiting her weight loss.    Review of Systems  All other systems reviewed and are negative.      Objective:   Physical Exam  Constitutional: She is oriented to person, place, and time. She appears well-developed and well-nourished.  Obese.   HENT:  Head: Normocephalic and atraumatic.  Cardiovascular: Normal rate, regular rhythm and normal heart sounds.   Pulmonary/Chest: Effort normal and breath sounds normal.  Neurological: She is alert and oriented to person, place, and time.  Skin: Skin is dry.  Psychiatric: She has a normal mood and affect. Her behavior is normal.          Assessment & Plan:  Obesity/abnormal weight gain-discussed only a 1 pound weight loss. Will refill phentermine for another month and added Wellbutrin for stress at work and hopefully some additional weight loss benefits. Will follow-up in 4 weeks. Topamax was tried in the past and she did not like Topamax.  Stress at work- wellbutrin xL  daily started. Discussed side effects. Follow up in 4 weeks.   Hypertension-rechecked blood pressure today and improved to 135/90. We'll continue to monitor.

## 2015-11-08 ENCOUNTER — Ambulatory Visit: Payer: BLUE CROSS/BLUE SHIELD | Admitting: Physician Assistant

## 2015-11-10 ENCOUNTER — Ambulatory Visit: Payer: BLUE CROSS/BLUE SHIELD | Admitting: Physician Assistant

## 2015-11-14 ENCOUNTER — Ambulatory Visit: Payer: BLUE CROSS/BLUE SHIELD | Admitting: Physician Assistant

## 2016-01-08 ENCOUNTER — Ambulatory Visit (INDEPENDENT_AMBULATORY_CARE_PROVIDER_SITE_OTHER): Payer: BLUE CROSS/BLUE SHIELD | Admitting: Physician Assistant

## 2016-01-08 ENCOUNTER — Encounter: Payer: Self-pay | Admitting: Physician Assistant

## 2016-01-08 VITALS — BP 140/100 | HR 80 | Ht 65.0 in | Wt 232.0 lb

## 2016-01-08 DIAGNOSIS — G43001 Migraine without aura, not intractable, with status migrainosus: Secondary | ICD-10-CM

## 2016-01-08 DIAGNOSIS — L7212 Trichodermal cyst: Secondary | ICD-10-CM

## 2016-01-08 DIAGNOSIS — G43101 Migraine with aura, not intractable, with status migrainosus: Secondary | ICD-10-CM

## 2016-01-08 DIAGNOSIS — R0789 Other chest pain: Secondary | ICD-10-CM | POA: Insufficient documentation

## 2016-01-08 DIAGNOSIS — I10 Essential (primary) hypertension: Secondary | ICD-10-CM | POA: Diagnosis not present

## 2016-01-08 DIAGNOSIS — L7211 Pilar cyst: Secondary | ICD-10-CM

## 2016-01-08 MED ORDER — SUMATRIPTAN SUCCINATE 100 MG PO TABS: 100.0000 mg | ORAL_TABLET | ORAL | Status: DC | PRN

## 2016-01-08 MED ORDER — NITROGLYCERIN 0.4 MG SL SUBL: 0.4000 mg | SUBLINGUAL_TABLET | SUBLINGUAL | Status: DC | PRN

## 2016-01-08 MED ORDER — KETOROLAC TROMETHAMINE 30 MG/ML IJ SOLN
30.0000 mg | Freq: Once | INTRAMUSCULAR | Status: AC
  Administered 2016-01-08: 30 mg via INTRAMUSCULAR

## 2016-01-08 NOTE — Progress Notes (Addendum)
Subjective:    Patient ID: Tammy Reed, female    DOB: 1966-03-17, 50 y.o.   MRN: [id number]  HPI  Patient is a 50 year old female who presents with a unilateral headache on right side that was preceded by seeing "little stars" Patient states that her headache is associated with nausea and vomiting, blurry vision, and dizziness. She is sensitive to light but not to sound. Patient has taken Tylenol and Advil, which has not provided her with relief. Patient has been increasingly stressed out lately due to her brother living with her. Additional concerns for this visit include a 2x3 cm non-painful mass on the right side of patient's skull that was discovered by patient's hair dresser about one month ago. Does not appear to have increased in size. Additional concerns for this visit also include non-radiating chest tightness/pain that has occurred for the past three days. At times she has to stop and pain radiates down right arm. Patient denies shortness of breath, fever, and sick contacts.    Review of Systems Please see HPI    Objective:   Physical Exam  Constitutional: She is oriented to person, place, and time. She appears well-developed and well-nourished. No distress.  HENT:  Right Ear: External ear normal.  Left Ear: External ear normal.  Nose: Nose normal.  Mouth/Throat: Oropharynx is clear and moist.  Patient has a 2x3 cm fluctuant mass palpated approximately 3 cm above the right ear.   Patient's fundoscopic exam is within normal range.   Eyes: Conjunctivae and EOM are normal. Pupils are equal, round, and reactive to light. Right eye exhibits no discharge. Left eye exhibits no discharge.  Neck: Normal range of motion. No tracheal deviation present. No thyromegaly present.  Cardiovascular: Normal rate, regular rhythm, normal heart sounds and intact distal pulses.  Exam reveals no friction rub.   No murmur heard. Pulmonary/Chest: Effort normal and breath sounds normal. No respiratory  distress. She has no wheezes. She has no rales. She exhibits no tenderness.  Abdominal: Soft.  Musculoskeletal: Normal range of motion.  Lymphadenopathy:    She has no cervical adenopathy.  Neurological: She is alert and oriented to person, place, and time. No cranial nerve deficit.  Skin: Skin is warm and dry. She is not diaphoretic.  Psychiatric: She has a normal mood and affect. Her behavior is normal. Judgment and thought content normal.      Assessment & Plan:   1. Migraine with aura Patient presents with unilateral headache preceded by aura and accompanied by nausea, vomiting and photophobia. Patient states that her brother has been living with her for the past couple months, which has brought the patient a great deal of stress. Stress might be a trigger for patient's migraine.  Patient was prescribed sumatriptan and given an injection of Toradol  IM in the office. Patient was advised to follow up if symptoms persist.   2. Pilar Cyst  Patient has a 2x3 cm fluctuant mass palpated approximately 3 cm above patient's right ear. Patient reports no pain to palpation. Patient education was provided. Patient did not want to be referred to dermatology at this time. Patient was advised to follow up if her symptoms become painful or bothersome.   3. Chest Tightness/Pain  Patient's in office ECG reveals findings consistent with borderline left ventricular hypertrophy and possible left bundle branch block. Reviewed with past EKG in 2014 with no significant changes. Patient will be referred to Cardiology for stress testing. Patient was prescribed nitroglycerin to be  used as needed. Red flags of cardiac symptoms discussed with patient and when to return to clinic or ER.  4. Essential Hypertension  Patient's most recent blood pressure is 160/104. Patient states that she has been compliant with pharmacotherapy regimen for hypertension consisting of amlodipine and lisinopril. Patient's blood pressure  is likely elevated due to pain. Patient's amlodipine was increased to 10 mg daily. Patient will follow up in one week to evaluate blood pressure.

## 2016-01-08 NOTE — Addendum Note (Signed)
Addended by: Jomarie Longs on: 01/08/2016 05:14 PM   Modules accepted: Orders, Level of Service

## 2016-01-10 NOTE — Addendum Note (Signed)
Addended by: Collie Siad on: 01/10/2016 10:32 AM   Modules accepted: Orders

## 2016-03-28 ENCOUNTER — Ambulatory Visit (INDEPENDENT_AMBULATORY_CARE_PROVIDER_SITE_OTHER): Payer: BLUE CROSS/BLUE SHIELD | Admitting: Physician Assistant

## 2016-03-28 ENCOUNTER — Encounter: Payer: Self-pay | Admitting: Physician Assistant

## 2016-03-28 VITALS — BP 159/81 | HR 71 | Ht 65.0 in | Wt 239.0 lb

## 2016-03-28 DIAGNOSIS — Z13220 Encounter for screening for lipoid disorders: Secondary | ICD-10-CM | POA: Diagnosis not present

## 2016-03-28 DIAGNOSIS — G5603 Carpal tunnel syndrome, bilateral upper limbs: Secondary | ICD-10-CM

## 2016-03-28 DIAGNOSIS — I10 Essential (primary) hypertension: Secondary | ICD-10-CM | POA: Diagnosis not present

## 2016-03-28 DIAGNOSIS — Z79899 Other long term (current) drug therapy: Secondary | ICD-10-CM

## 2016-03-28 DIAGNOSIS — E669 Obesity, unspecified: Secondary | ICD-10-CM | POA: Diagnosis not present

## 2016-03-28 MED ORDER — MELOXICAM 15 MG PO TABS: 15.0000 mg | ORAL_TABLET | Freq: Every day | ORAL | Status: DC

## 2016-03-28 MED ORDER — AMLODIPINE BESYLATE 5 MG PO TABS: 5.0000 mg | ORAL_TABLET | Freq: Every day | ORAL | Status: DC

## 2016-03-28 MED ORDER — LIRAGLUTIDE -WEIGHT MANAGEMENT 18 MG/3ML ~~LOC~~ SOPN: 0.6000 mg | PEN_INJECTOR | Freq: Every day | SUBCUTANEOUS | Status: DC

## 2016-03-28 MED ORDER — LISINOPRIL-HYDROCHLOROTHIAZIDE 20-25 MG PO TABS: 1.0000 | ORAL_TABLET | Freq: Every day | ORAL | Status: DC

## 2016-03-28 MED ORDER — BUPROPION HCL ER (XL) 150 MG PO TB24: 150.0000 mg | ORAL_TABLET | Freq: Every day | ORAL | Status: DC

## 2016-03-28 NOTE — Progress Notes (Signed)
   Subjective:    Patient ID: Tammy Reed, female    DOB: May 17, 1966, 50 y.o.   MRN: [id number]  HPI Pt presents to the clinic for BP follow and get refill. She has been out of medication for the last 2 weeks. No CP, palpitations, headaches, dizziness.   She continues to want to lose weight. Tried belviq and phentermine with little benefit. And BP always went up with phentermine.   She loved wellbutrin for stress and anxiety but stopped because refills ran out. She would like to go back on.   Bilateral hand numbness and tingling at night and first thing in morning. Wears night splints not taking any medication.    Review of Systems See HPI.     Objective:   Physical Exam  Constitutional: She appears well-developed and well-nourished.  Obese.   HENT:  Head: Normocephalic and atraumatic.  Cardiovascular: Normal rate, regular rhythm and normal heart sounds.   Pulmonary/Chest: Effort normal and breath sounds normal. She has no wheezes.  Musculoskeletal:  Normal ROM of bilateral wrist.  positive tinels bilateral.  positivie phalens left worse than right.  Hand grip normal.    Psychiatric: She has a normal mood and affect. Her behavior is normal.          Assessment & Plan:  HTN- refilled BP medications. Nurse visit in 2 weeks after restarting medications.   Obesity- belviq did not have great results. Phentermine worked at first but has stopped and BP too high. Will try saxenda. Discussed side effects. Come back in for instructions on use if needed. Coupon card given. Follow up in 6 weeks after start.   Stress at work/anxiety- wellbutrin refilled for 6 months. Discussed this is a ongoing medication to take daily and ongoing. She does not have to stop.   Bilateral carpal tunnel syndrome- continue to wear night splints. mobic given as needed. Discussed sports medicine and hydrodissection with T or Corey. She will hold off for now.

## 2016-03-28 NOTE — Patient Instructions (Signed)

## 2016-04-02 ENCOUNTER — Telehealth: Payer: Self-pay | Admitting: *Deleted

## 2016-04-02 NOTE — Telephone Encounter (Signed)
PA initiated for saxenda   Approvedtoday  OZHYQM:[id number];XBMWUXLaseId:[id number];Product Name:Weight loss drugs - Saxenda - [place] ESI;Status:Approved;Coverage Start Date:03/03/2016;Coverage End Date:07/31/2016;  Pt and pharm notified via vm

## 2016-04-04 ENCOUNTER — Telehealth: Payer: Self-pay | Admitting: *Deleted

## 2016-04-04 ENCOUNTER — Ambulatory Visit (INDEPENDENT_AMBULATORY_CARE_PROVIDER_SITE_OTHER): Payer: BLUE CROSS/BLUE SHIELD | Admitting: Physician Assistant

## 2016-04-04 ENCOUNTER — Encounter: Payer: Self-pay | Admitting: Physician Assistant

## 2016-04-04 VITALS — BP 138/70 | HR 75 | Ht 65.0 in | Wt 239.0 lb

## 2016-04-04 DIAGNOSIS — E669 Obesity, unspecified: Secondary | ICD-10-CM

## 2016-04-04 MED ORDER — AMBULATORY NON FORMULARY MEDICATION: Status: DC

## 2016-04-04 NOTE — Telephone Encounter (Signed)
closed

## 2016-04-04 NOTE — Progress Notes (Signed)
   Subjective:    Patient ID: Tammy PrestoCathy Reed, female    DOB: Mar 01, 1966, 50 y.o.   MRN: [id number] Pt was in today for education on using Saxenda.  Pt did her own injection without complications.  Donne AnonAmber Lachandra Dettmann, CMA HPI    Review of Systems     Objective:   Physical Exam        Assessment & Plan:

## 2016-06-18 ENCOUNTER — Ambulatory Visit: Payer: BLUE CROSS/BLUE SHIELD | Admitting: Physician Assistant

## 2016-06-21 ENCOUNTER — Encounter: Payer: Self-pay | Admitting: Physician Assistant

## 2016-06-21 ENCOUNTER — Ambulatory Visit (INDEPENDENT_AMBULATORY_CARE_PROVIDER_SITE_OTHER): Payer: BLUE CROSS/BLUE SHIELD | Admitting: Physician Assistant

## 2016-06-21 VITALS — BP 164/85 | HR 68 | Ht 65.0 in | Wt 209.0 lb

## 2016-06-21 DIAGNOSIS — I10 Essential (primary) hypertension: Secondary | ICD-10-CM

## 2016-06-21 DIAGNOSIS — Z566 Other physical and mental strain related to work: Secondary | ICD-10-CM

## 2016-06-21 DIAGNOSIS — E669 Obesity, unspecified: Secondary | ICD-10-CM

## 2016-06-21 MED ORDER — LISINOPRIL-HYDROCHLOROTHIAZIDE 20-25 MG PO TABS: 1.0000 | ORAL_TABLET | Freq: Every day | ORAL | 1 refills | Status: DC

## 2016-06-21 MED ORDER — LIRAGLUTIDE -WEIGHT MANAGEMENT 18 MG/3ML ~~LOC~~ SOPN: 0.6000 mg | PEN_INJECTOR | Freq: Every day | SUBCUTANEOUS | 2 refills | Status: DC

## 2016-06-21 MED ORDER — AMLODIPINE BESYLATE 5 MG PO TABS
5.0000 mg | ORAL_TABLET | Freq: Every day | ORAL | 1 refills | Status: DC
Start: 2016-06-21 — End: 2016-10-09

## 2016-06-21 MED ORDER — BUPROPION HCL ER (XL) 150 MG PO TB24: 150.0000 mg | ORAL_TABLET | Freq: Every day | ORAL | 1 refills | Status: DC

## 2016-06-21 MED ORDER — AMBULATORY NON FORMULARY MEDICATION: 0 refills | Status: DC

## 2016-06-21 NOTE — Progress Notes (Signed)
   Subjective:    Patient ID: Tammy Reed, female    DOB: [birth year], 50 y.o.   MRN: [id number]  HPI Pt is a 50 yo female who presents to the clinic for BP follow up and medication refill. She has been out of her BP meds for 3 weeks. She denies any CP, palpations, headaches, dizziness. She has not been checking BP at home.   She was started on saxenda in may 2017. She has lost 30lbs and feeling great. She is exercising daily. She is motivated and has self confidence. She denies any nausea or side effects. She has also noticed wellbutrin helping her mood at work. She denies any side effects.    Review of Systems  All other systems reviewed and are negative.      Objective:   Physical Exam  Constitutional: She is oriented to person, place, and time. She appears well-developed and well-nourished.  HENT:  Head: Normocephalic and atraumatic.  Cardiovascular: Normal rate, regular rhythm and normal heart sounds.   Pulmonary/Chest: Effort normal and breath sounds normal.  Neurological: She is alert and oriented to person, place, and time.  Psychiatric: She has a normal mood and affect. Her behavior is normal.          Assessment & Plan:  Obesity- BMI 34. Down 30lbs. Wonderful progress. Refilled for 3 months. Continue diet and exercise plan. Goal is to get BMI below 30.   Stress at work- continue wellbutrin.   HTN- refilled BP medications. Discuss importance of taking medications. Follow up in 3 months. Discussed to take BP at home and/or pharmacy and make sure below 140/90.

## 2016-08-18 ENCOUNTER — Encounter (HOSPITAL_COMMUNITY): Payer: Self-pay | Admitting: Emergency Medicine

## 2016-08-18 ENCOUNTER — Emergency Department (HOSPITAL_COMMUNITY)
Admission: EM | Admit: 2016-08-18 | Discharge: 2016-08-18 | Disposition: A | Discharge status: Home / Self Care | Payer: BLUE CROSS/BLUE SHIELD | Attending: Emergency Medicine | Admitting: Emergency Medicine

## 2016-08-18 ENCOUNTER — Emergency Department (HOSPITAL_COMMUNITY): Payer: BLUE CROSS/BLUE SHIELD

## 2016-08-18 DIAGNOSIS — K5900 Constipation, unspecified: Secondary | ICD-10-CM | POA: Diagnosis present

## 2016-08-18 DIAGNOSIS — I10 Essential (primary) hypertension: Secondary | ICD-10-CM | POA: Diagnosis not present

## 2016-08-18 DIAGNOSIS — K5909 Other constipation: Secondary | ICD-10-CM | POA: Diagnosis not present

## 2016-08-18 LAB — POC OCCULT BLOOD, ED: Fecal Occult Bld: NEGATIVE

## 2016-08-18 MED ORDER — POLYETHYLENE GLYCOL 3350 17 G PO PACK: 17.0000 g | PACK | Freq: Every day | ORAL | 0 refills | Status: DC

## 2016-08-18 MED ORDER — MILK AND MOLASSES ENEMA
1.0000 | Freq: Once | RECTAL | Status: AC
  Administered 2016-08-18: 250 mL via RECTAL
  Filled 2016-08-18: qty 250

## 2016-08-18 MED ORDER — POLYETHYLENE GLYCOL 3350 17 G PO PACK: 17.0000 g | PACK | Freq: Every day | ORAL | Status: DC

## 2016-08-18 NOTE — ED Triage Notes (Signed)
Pt reports last BM last week. Pt reports four hours of generalized abdominal pain, non tender to palpation. Pt also reports she does not drink much water. Denies N/V/D.

## 2016-08-18 NOTE — ED Provider Notes (Signed)
[facility]-EMERGENCY DEPT Provider Note   CSN: [id number] Arrival date & time: 08/18/16  4098     History   Chief Complaint Chief Complaint  Patient presents with  . Abdominal Pain  . Constipation    HPI Tammy Reed is a 50 y.o. female.   Patient presents today with a 4-5 hour history of generalized abdominal pain.  She is unable to describe the pain when prompted.  She claims her pain is worsening.  Patient admits to not having a BM in the last 7 days.  The pain is nonradiating.  No nausea, vomiting, or diarrhea.  No bloody stools.  No urinary symptoms or fever/chills.  She is able to pass gas and has a normal appetite. Patient has a history of constipation and usually takes an enema when it gets bad.  No history of abdominal surgery.  No other complaints.        Past Medical History:  Diagnosis Date  . Hypertension     Patient Active Problem List   Diagnosis Date Noted  . Bilateral carpal tunnel syndrome 03/28/2016  . Pilar and trichilemmal cysts 01/08/2016  . Chest tightness 01/08/2016  . Migraine without aura and with status migrainosus, not intractable 01/08/2016  . Atypical chest pain 01/08/2016  . Obese 08/14/2015  . Stress at work 08/14/2015  . Dental caries 06/14/2014  . Constipation 04/08/2014  . Abnormal weight gain 04/08/2014  . Essential hypertension, benign 03/03/2013  . Migraine with aura 10/21/2012  . Obesity (BMI 30-39.9) 10/15/2011    No past surgical history on file.  OB History    No data available       Home Medications    Prior to Admission medications   Medication Sig Start Date End Date Taking? Authorizing Provider  AMBULATORY NON FORMULARY MEDICATION 31g x 5/16 pen needles for Saxenda 06/21/16   Jade L Breeback, PA-C  amLODipine (NORVASC) 5 MG tablet Take 1 tablet (5 mg total) by mouth daily. 06/21/16   Jade L Breeback, PA-C  buPROPion (WELLBUTRIN XL) 150 MG 24 hr tablet Take 1 tablet (150 mg total) by mouth daily. 06/21/16   Jade L Breeback,  PA-C  Liraglutide -Weight Management (SAXENDA) 18 MG/3ML SOPN Inject 0.6 mg into the skin daily. 3mg  daily.  Please include ultra fine needles 6mm 06/21/16   Jade L Breeback, PA-C  lisinopril-hydrochlorothiazide (PRINZIDE,ZESTORETIC) 20-25 MG tablet Take 1 tablet by mouth daily. 06/21/16   Jade L Breeback, PA-C  meloxicam (MOBIC) 15 MG tablet Take 1 tablet (15 mg total) by mouth daily. 03/28/16   Jade L Breeback, PA-C  nitroGLYCERIN (NITROSTAT) 0.4 MG SL tablet Place 1 tablet (0.4 mg total) under the tongue every 5 (five) minutes as needed for chest pain. 01/08/16   Jade L Breeback, PA-C  polyethylene glycol (MIRALAX / GLYCOLAX) packet Take 17 g by mouth daily. 08/18/16   Jacalyn Lefevre, MD  SUMAtriptan (IMITREX) 100 MG tablet Take 1 tablet (100 mg total) by mouth every 2 (two) hours as needed for migraine. May repeat in 2 hours if headache persists or recurs. 01/08/16   Jomarie Longs, PA-C    Family History Family History  Problem Relation Age of Onset  . Heart disease Mother   . Depression Brother     Social History Social History  Substance Use Topics  . Smoking status: Never Smoker  . Smokeless tobacco: Never Used  . Alcohol use No     Allergies   Review of patient's allergies indicates no known allergies.  Review of Systems Review of Systems  Constitutional: Negative for chills and fever.  HENT: Negative.   Eyes: Negative.   Respiratory: Negative for cough and shortness of breath.   Cardiovascular: Negative for chest pain.  Gastrointestinal: Positive for abdominal pain and constipation. Negative for blood in stool, diarrhea, nausea and vomiting.  Endocrine: Negative.   Genitourinary: Negative for difficulty urinating, dysuria and frequency.  Musculoskeletal: Negative.   Skin: Negative.   Neurological: Negative.      Physical Exam Updated Vital Signs BP 143/96 (BP Location: Left Wrist)   Pulse 76   Temp 98.4 F (36.9 C) (Oral)   Resp 14   SpO2 100%   Physical  Exam  Constitutional: She is oriented to person, place, and time. She appears well-developed and well-nourished. No distress.  HENT:  Head: Normocephalic and atraumatic.  Eyes: EOM are normal.  Neck: Normal range of motion. Neck supple.  Cardiovascular: Normal rate, regular rhythm and normal heart sounds.   Pulmonary/Chest: Effort normal and breath sounds normal. No respiratory distress.  Abdominal: Soft. Bowel sounds are normal. She exhibits no distension and no mass. There is no tenderness. There is no rebound and no guarding.  Genitourinary: Rectum normal. Rectal exam shows no mass and guaiac negative stool.  Musculoskeletal: She exhibits no edema.  Neurological: She is alert and oriented to person, place, and time.  Skin: Skin is warm and dry.  Psychiatric: She has a normal mood and affect.     ED Treatments / Results  Labs (all labs ordered are listed, but only abnormal results are displayed) Labs Reviewed  POC OCCULT BLOOD, ED    EKG  EKG Interpretation None       Radiology No results found.  Procedures Procedures (including critical care time)  Medications Ordered in ED Medications  milk and molasses enema (250 mLs Rectal Given 08/18/16 0857)     Initial Impression / Assessment and Plan / ED Course  I have reviewed the triage vital signs and the nursing notes.  Pertinent labs & imaging results that were available during my care of the patient were reviewed by me and considered in my medical decision making (see chart for details).  Clinical Course    50 year old female with a history of constipation presents to the ED with 5 hour history of worsening generalized abdominal pain and constipation.  She has no other symptoms such as fever, nausea, vomiting, or dysuria.  Her abdomen is nontender and she appears well.  Her FOBT was negative for blood.  Abdominal Xray showed increased stool burden without evidence of obstruction or pneumoperitoneum.  She was given an  enema and Miralax which resulted in a large bowel movement and improvement of pain.  We will send her home on Miralax.    Final Clinical Impressions(s) / ED Diagnoses   Final diagnoses:  Other constipation    New Prescriptions Discharge Medication List as of 08/18/2016  9:42 AM    START taking these medications   Details  polyethylene glycol (MIRALAX / GLYCOLAX) packet Take 17 g by mouth daily., Starting Sun 08/18/2016, Print         Jacalyn LefevreJulie Arletta Lumadue, MD 08/22/16 2133

## 2016-09-23 ENCOUNTER — Telehealth: Payer: Self-pay | Admitting: *Deleted

## 2016-09-23 NOTE — Telephone Encounter (Signed)
Reauthorization approved [id number] - PA Case ID: [id number]  Left message on pharm vm. Patient's vm has not been set up.

## 2016-10-09 ENCOUNTER — Encounter: Payer: Self-pay | Admitting: Physician Assistant

## 2016-10-09 ENCOUNTER — Ambulatory Visit (INDEPENDENT_AMBULATORY_CARE_PROVIDER_SITE_OTHER): Payer: BLUE CROSS/BLUE SHIELD | Admitting: Physician Assistant

## 2016-10-09 VITALS — BP 142/78 | HR 65 | Ht 65.0 in | Wt 201.0 lb

## 2016-10-09 DIAGNOSIS — E669 Obesity, unspecified: Secondary | ICD-10-CM

## 2016-10-09 DIAGNOSIS — I10 Essential (primary) hypertension: Secondary | ICD-10-CM | POA: Diagnosis not present

## 2016-10-09 MED ORDER — BUPROPION HCL ER (XL) 150 MG PO TB24: 150.0000 mg | ORAL_TABLET | Freq: Every day | ORAL | 1 refills | Status: DC

## 2016-10-09 MED ORDER — LISINOPRIL-HYDROCHLOROTHIAZIDE 20-25 MG PO TABS: 1.0000 | ORAL_TABLET | Freq: Every day | ORAL | 1 refills | Status: DC

## 2016-10-09 MED ORDER — AMBULATORY NON FORMULARY MEDICATION: 0 refills | Status: DC

## 2016-10-09 MED ORDER — AMLODIPINE BESYLATE 5 MG PO TABS: 5.0000 mg | ORAL_TABLET | Freq: Every day | ORAL | 1 refills | Status: DC

## 2016-10-09 NOTE — Progress Notes (Addendum)
   Subjective:    Patient ID: Tammy Reed, female    DOB: Oct 06, 1966, 50 y.o.   MRN: [id number]  HPI Patient is a 50 yo female coming to the office for a weight check. Patient has been taking saxenda for weight loss. Patient stopped taking saxenda in September 2017 due to her insurance no longer covering the medication. Her insurance now covers saxenda; however, she has not had the prescription filled since September and was waiting to come into the office today before starting the medication again. Patient also wants a refill on prinzide and norvasc and has not taken both of those medications since September due to running out. Patient has not checked her blood pressure at home. Patient also needs needles for the saxenda. Patient has no other complaints. Patient denies chest pain and shortness of breath.   Vitals:   10/09/16 0817 10/09/16 0828  BP: (!) 157/78 (!) 142/78  Pulse: 65    Review of Systems See HPI     Objective:   Physical Exam  Constitutional: She is oriented to person, place, and time. She appears well-developed and well-nourished. No distress.  HENT:  Head: Normocephalic and atraumatic.  Cardiovascular: Normal rate and regular rhythm.  Exam reveals no gallop and no friction rub.   No murmur heard. Pulmonary/Chest: Effort normal and breath sounds normal. No respiratory distress. She has no wheezes. She has no rales.  Neurological: She is alert and oriented to person, place, and time.  Psychiatric: She has a normal mood and affect. Her behavior is normal.          Assessment & Plan:   Tammy Reed was seen today for hypertension and obesity.  Diagnoses and all orders for this visit:  Essential hypertension, benign -     amLODipine (NORVASC) 5 MG tablet; Take 1 tablet (5 mg total) by mouth daily. -     lisinopril-hydrochlorothiazide (PRINZIDE,ZESTORETIC) 20-25 MG tablet; Take 1 tablet by mouth daily.  Obesity (BMI 30-39.9) -     buPROPion (WELLBUTRIN XL) 150 MG 24 hr  tablet; Take 1 tablet (150 mg total) by mouth daily. -     AMBULATORY NON FORMULARY MEDICATION; 31g x 5/16 pen needles for Saxenda   Refilled norvasc and prinzide for hypertension. Educated patient that when she runs out of these medications, please call the office and let us know.   Refilled Wellbutrin and reminded patient that she can pick up saxenda at the pharmacy. Sent needles into the pharmacy for saxenda. Follow-up in one month to re-check weight.

## 2016-10-11 ENCOUNTER — Other Ambulatory Visit: Payer: Self-pay | Admitting: Physician Assistant

## 2016-10-16 ENCOUNTER — Ambulatory Visit: Payer: BLUE CROSS/BLUE SHIELD | Admitting: Physician Assistant

## 2017-12-16 ENCOUNTER — Ambulatory Visit: Payer: BLUE CROSS/BLUE SHIELD | Admitting: Physician Assistant

## 2017-12-16 ENCOUNTER — Encounter: Payer: Self-pay | Admitting: Physician Assistant

## 2017-12-16 VITALS — BP 149/71 | HR 70 | Ht 65.0 in | Wt 215.0 lb

## 2017-12-16 DIAGNOSIS — M79642 Pain in left hand: Secondary | ICD-10-CM | POA: Diagnosis not present

## 2017-12-16 DIAGNOSIS — Z131 Encounter for screening for diabetes mellitus: Secondary | ICD-10-CM | POA: Diagnosis not present

## 2017-12-16 DIAGNOSIS — Z6835 Body mass index (BMI) 35.0-35.9, adult: Secondary | ICD-10-CM

## 2017-12-16 DIAGNOSIS — E6609 Other obesity due to excess calories: Secondary | ICD-10-CM | POA: Diagnosis not present

## 2017-12-16 DIAGNOSIS — M25649 Stiffness of unspecified hand, not elsewhere classified: Secondary | ICD-10-CM

## 2017-12-16 DIAGNOSIS — I10 Essential (primary) hypertension: Secondary | ICD-10-CM | POA: Diagnosis not present

## 2017-12-16 DIAGNOSIS — Z13220 Encounter for screening for lipoid disorders: Secondary | ICD-10-CM | POA: Diagnosis not present

## 2017-12-16 DIAGNOSIS — M79641 Pain in right hand: Secondary | ICD-10-CM

## 2017-12-16 DIAGNOSIS — E66812 Obesity, class 2: Secondary | ICD-10-CM

## 2017-12-16 MED ORDER — HYDROCHLOROTHIAZIDE 12.5 MG PO TABS
25.0000 mg | ORAL_TABLET | Freq: Every day | ORAL | 2 refills | Status: DC
Start: 2017-12-16 — End: 2018-08-03

## 2017-12-16 MED ORDER — MELOXICAM 15 MG PO TABS: 15.0000 mg | ORAL_TABLET | Freq: Every day | ORAL | 1 refills | Status: DC

## 2017-12-16 MED ORDER — LIRAGLUTIDE -WEIGHT MANAGEMENT 18 MG/3ML ~~LOC~~ SOPN: 0.6000 mg | PEN_INJECTOR | Freq: Every day | SUBCUTANEOUS | 1 refills | Status: DC

## 2017-12-16 NOTE — Progress Notes (Signed)
Subjective:    Patient ID: Tammy Reed, female    DOB: 02-28-1966, 52 y.o.   MRN: [id number]  HPI Patient is a 52 year old female with hypertension, migraines, obesity who presents to the clinic to discuss weight loss.  She was previously on Saxenda and had great success.  She was under 200 pounds at one time.  She is now gained at least 15-20 pounds back and at 215.  She had no side effects with this medicine and very interested in restarting.  She admits she has not dieting or exercising.  Patient has stopped all of her medications.  She wonders if she needs to go back on blood pressure medication.  She denies any chest pains, palpitations, headaches, vision changes.  She also c/o on the way out the door of bilateral hand pain in all her joints and morning stiffiness. She has not tried anything to make better. She denies any family hx of RA. She denies any trauma. She did start a new job that she is working with her hands a lot.   .. Active Ambulatory Problems    Diagnosis Date Noted  . Obesity (BMI 30-39.9) 10/15/2011  . Migraine with aura 10/21/2012  . Essential hypertension, benign 03/03/2013  . Constipation 04/08/2014  . Abnormal weight gain 04/08/2014  . Dental caries 06/14/2014  . Obese 08/14/2015  . Stress at work 08/14/2015  . Pilar and trichilemmal cysts 01/08/2016  . Chest tightness 01/08/2016  . Migraine without aura and with status migrainosus, not intractable 01/08/2016  . Atypical chest pain 01/08/2016  . Bilateral carpal tunnel syndrome 03/28/2016   Resolved Ambulatory Problems    Diagnosis Date Noted  . SUBCONJUNCTIVAL HEMORRHAGE 07/23/2011  . BMI 39.0-39.9,adult 04/08/2014   Past Medical History:  Diagnosis Date  . Hypertension       Review of Systems  All other systems reviewed and are negative.      Objective:   Physical Exam  Constitutional: She is oriented to person, place, and time. She appears well-developed and well-nourished.  HENT:  Head:  Normocephalic and atraumatic.  Cardiovascular: Normal rate, regular rhythm and normal heart sounds.  Pulmonary/Chest: Effort normal and breath sounds normal.  Musculoskeletal:  Normal ROM at bilateral wrist and finger joints.  No swelling or joint tenderness noted.  Normal hand grip 5/5, bilaterally.   Neurological: She is alert and oriented to person, place, and time.  Psychiatric: She has a normal mood and affect. Her behavior is normal.          Assessment & Plan:  Marland KitchenMarland KitchenMikal was seen today for follow-up.  Diagnoses and all orders for this visit:  Class 2 obesity due to excess calories without serious comorbidity with body mass index (BMI) of 35.0 to 35.9 in adult -     Liraglutide -Weight Management (SAXENDA) 18 MG/3ML SOPN; Inject 0.6 mg into the skin daily. For one week then increase by .41m weekly until reaches 340mdaily.  Please include ultra fine needles 17m64mScreening for diabetes mellitus -     COMPLETE METABOLIC PANEL WITH GFR  Screening for lipid disorders -     Lipid Panel w/reflex Direct LDL  Morning joint stiffness of hand, unspecified laterality -     Rheumatoid Factor -     Cyclic citrul peptide antibody, IgG -     ANA -     Sed Rate (ESR) -     DG Hand Complete Right -     DG Hand Complete Left -  meloxicam (MOBIC) 15 MG tablet; Take 1 tablet (15 mg total) by mouth daily.  Bilateral hand pain -     Rheumatoid Factor -     Cyclic citrul peptide antibody, IgG -     ANA -     Sed Rate (ESR) -     DG Hand Complete Right -     DG Hand Complete Left -     meloxicam (MOBIC) 15 MG tablet; Take 1 tablet (15 mg total) by mouth daily.  Essential hypertension, benign -     hydrochlorothiazide (HYDRODIURIL) 12.5 MG tablet; Take 2 tablets (25 mg total) by mouth daily.   Pt has tolerated saxenda in the past well. Restarted today. Discussed titration up.  Marland Kitchen.Discussed low carb diet with 1500 calories and 80g of protein.  Exercising at least 150 minutes a week.   [employer] could be a Microbiologist.  Follow up in 8 weeks.   Restart HCTZ for HTN.   Labs ordered to evaluate bilateral hand pain. Discussed OA vs RA. Concerned via morning stiffiness for RA. Given mobic to use as needed. Bilateral hand xrays ordered.

## 2017-12-17 DIAGNOSIS — M79641 Pain in right hand: Secondary | ICD-10-CM | POA: Insufficient documentation

## 2017-12-17 DIAGNOSIS — M25649 Stiffness of unspecified hand, not elsewhere classified: Secondary | ICD-10-CM | POA: Insufficient documentation

## 2017-12-17 DIAGNOSIS — M79642 Pain in left hand: Secondary | ICD-10-CM

## 2017-12-24 ENCOUNTER — Telehealth: Payer: Self-pay | Admitting: Physician Assistant

## 2017-12-24 NOTE — Telephone Encounter (Signed)
Received form from [employer] od [place] for prior auth on Saxenda.  Form filled out and faxed back to [facility] at [phone].  Awaiting approval. KG LPN

## 2018-01-12 ENCOUNTER — Other Ambulatory Visit: Payer: Self-pay | Admitting: Physician Assistant

## 2018-01-12 DIAGNOSIS — E6609 Other obesity due to excess calories: Secondary | ICD-10-CM

## 2018-01-12 DIAGNOSIS — Z6835 Body mass index (BMI) 35.0-35.9, adult: Principal | ICD-10-CM

## 2018-01-12 NOTE — Telephone Encounter (Signed)
Submitted more clinical information that [facility] Awaiting determination.

## 2018-01-13 ENCOUNTER — Ambulatory Visit: Payer: BLUE CROSS/BLUE SHIELD | Admitting: Physician Assistant

## 2018-01-16 NOTE — Telephone Encounter (Signed)
Received a fax from [employer] that Saxenda was approved from 12/23/2017 until 12/22/2018. Pharmacy has been notified about approval. Form sent scan.   Reference: [id number].

## 2018-05-18 ENCOUNTER — Other Ambulatory Visit: Payer: Self-pay | Admitting: *Deleted

## 2018-05-18 DIAGNOSIS — E669 Obesity, unspecified: Secondary | ICD-10-CM

## 2018-05-18 MED ORDER — AMBULATORY NON FORMULARY MEDICATION: 0 refills | Status: DC

## 2018-05-20 ENCOUNTER — Other Ambulatory Visit: Payer: Self-pay

## 2018-05-20 MED ORDER — INSULIN PEN NEEDLE 31G X 8 MM MISC: 3 refills | Status: DC

## 2018-05-22 ENCOUNTER — Ambulatory Visit: Payer: BLUE CROSS/BLUE SHIELD | Admitting: Physician Assistant

## 2018-05-22 ENCOUNTER — Other Ambulatory Visit: Payer: Self-pay

## 2018-06-19 ENCOUNTER — Ambulatory Visit: Payer: BLUE CROSS/BLUE SHIELD | Admitting: Physician Assistant

## 2018-08-03 ENCOUNTER — Ambulatory Visit (INDEPENDENT_AMBULATORY_CARE_PROVIDER_SITE_OTHER): Payer: BLUE CROSS/BLUE SHIELD | Admitting: Physician Assistant

## 2018-08-03 ENCOUNTER — Encounter: Payer: Self-pay | Admitting: Physician Assistant

## 2018-08-03 VITALS — BP 142/53 | HR 70 | Ht 65.0 in | Wt 211.0 lb

## 2018-08-03 DIAGNOSIS — Z0000 Encounter for general adult medical examination without abnormal findings: Secondary | ICD-10-CM | POA: Diagnosis not present

## 2018-08-03 DIAGNOSIS — Z1211 Encounter for screening for malignant neoplasm of colon: Secondary | ICD-10-CM

## 2018-08-03 DIAGNOSIS — Z1231 Encounter for screening mammogram for malignant neoplasm of breast: Secondary | ICD-10-CM

## 2018-08-03 DIAGNOSIS — Z23 Encounter for immunization: Secondary | ICD-10-CM

## 2018-08-03 DIAGNOSIS — Z1239 Encounter for other screening for malignant neoplasm of breast: Secondary | ICD-10-CM

## 2018-08-03 DIAGNOSIS — E6609 Other obesity due to excess calories: Secondary | ICD-10-CM

## 2018-08-03 DIAGNOSIS — Z13220 Encounter for screening for lipoid disorders: Secondary | ICD-10-CM | POA: Diagnosis not present

## 2018-08-03 DIAGNOSIS — I10 Essential (primary) hypertension: Secondary | ICD-10-CM

## 2018-08-03 DIAGNOSIS — Z131 Encounter for screening for diabetes mellitus: Secondary | ICD-10-CM | POA: Diagnosis not present

## 2018-08-03 DIAGNOSIS — Z6835 Body mass index (BMI) 35.0-35.9, adult: Secondary | ICD-10-CM

## 2018-08-03 MED ORDER — LIRAGLUTIDE -WEIGHT MANAGEMENT 18 MG/3ML ~~LOC~~ SOPN: 0.6000 mg | PEN_INJECTOR | Freq: Every day | SUBCUTANEOUS | 2 refills | Status: DC

## 2018-08-03 NOTE — Patient Instructions (Signed)

## 2018-08-03 NOTE — Progress Notes (Addendum)
Subjective:     Tammy PrestoCathy Reed is a 52 y.o. female and is here for a comprehensive physical exam. The patient reports problems - would like to go back on saxenda for weight loss. had done really well and just stopped. she is not exercising. she does admit to not taking blood pressure medication because ran out and did not come back in. no CP, palpitations, headaches. feeling great. .  Social History   Socioeconomic History  . Marital status: Single    Spouse name: Not on file  . Number of children: Not on file  . Years of education: Not on file  . Highest education level: Not on file  Occupational History  . Not on file  Social Needs  . Financial resource strain: Not on file  . Food insecurity:    Worry: Not on file    Inability: Not on file  . Transportation needs:    Medical: Not on file    Non-medical: Not on file  Tobacco Use  . Smoking status: Never Smoker  . Smokeless tobacco: Never Used  Substance and Sexual Activity  . Alcohol use: No  . Drug use: No  . Sexual activity: Yes    Birth control/protection: IUD  Lifestyle  . Physical activity:    Days per week: Not on file    Minutes per session: Not on file  . Stress: Not on file  Relationships  . Social connections:    Talks on phone: Not on file    Gets together: Not on file    Attends religious service: Not on file    Active member of club or organization: Not on file    Attends meetings of clubs or organizations: Not on file    Relationship status: Not on file  . Intimate partner violence:    Fear of current or ex partner: Not on file    Emotionally abused: Not on file    Physically abused: Not on file    Forced sexual activity: Not on file  Other Topics Concern  . Not on file  Social History Narrative  . Not on file   Health Maintenance  Topic Date Due  . MAMMOGRAM  01/12/2018  . COLONOSCOPY  01/12/2018  . PAP SMEAR  11/03/2018 (Originally 06/03/2015)  . HIV Screening  08/04/2019 (Originally [birth year]/1983)   . TETANUS/TDAP  07/23/2022  . INFLUENZA VACCINE  Completed    The following portions of the patient's history were reviewed and updated as appropriate: allergies, current medications, past family history, past medical history, past social history, past surgical history and problem list.  Review of Systems Pertinent items noted in HPI and remainder of comprehensive ROS otherwise negative.   Objective:    BP (!) 142/53   Pulse 70   Ht 5\' 5"  (1.651 m)   Wt 211 lb (95.7 kg)   BMI 35.11 kg/m  General appearance: alert, cooperative, appears stated age and moderately obese Head: Normocephalic, without obvious abnormality, atraumatic Eyes: conjunctivae/corneas clear. PERRL, EOM's intact. Fundi benign. Ears: normal TM's and external ear canals both ears Nose: Nares normal. Septum midline. Mucosa normal. No drainage or sinus tenderness. Throat: lips, mucosa, and tongue normal; teeth and gums normal Neck: no adenopathy, no carotid bruit, no JVD, supple, symmetrical, trachea midline and thyroid not enlarged, symmetric, no tenderness/mass/nodules Back: symmetric, no curvature. ROM normal. No CVA tenderness. Lungs: clear to auscultation bilaterally Heart: regular rate and rhythm, S1, S2 normal, no murmur, click, rub or gallop Abdomen: soft, non-tender; bowel  sounds normal; no masses,  no organomegaly Extremities: extremities normal, atraumatic, no cyanosis or edema Pulses: 2+ and symmetric Skin: Skin color, texture, turgor normal. No rashes or lesions Lymph nodes: Cervical, supraclavicular, and axillary nodes normal. Neurologic: Alert and oriented X 3, normal strength and tone. Normal symmetric reflexes. Normal coordination and gait    Assessment:    Healthy female exam.      Plan:    Marland KitchenMarland KitchenShantell was seen today for annual exam.  Diagnoses and all orders for this visit:  Routine physical examination -     Lipid Panel w/reflex Direct LDL -     COMPLETE METABOLIC PANEL WITH GFR -     MM  3D SCREEN BREAST BILATERAL -     Ambulatory referral to Gastroenterology -     TSH  Screening for lipid disorders -     Lipid Panel w/reflex Direct LDL  Screening for diabetes mellitus -     COMPLETE METABOLIC PANEL WITH GFR  Breast cancer screening -     MM 3D SCREEN BREAST BILATERAL  Colon cancer screening -     Ambulatory referral to Gastroenterology  Class 2 obesity due to excess calories without serious comorbidity with body mass index (BMI) of 35.0 to 35.9 in adult -     TSH -     Liraglutide -Weight Management (SAXENDA) 18 MG/3ML SOPN; Inject 0.6 mg into the skin daily. For one week then increase by .6mg  weekly until reaches 3mg  daily.  Please include ultra fine needles 6mm  Needs flu shot -     Flu Vaccine QUAD 6+ mos PF IM (Fluarix Quad PF)  Essential hypertension, benign -     lisinopril-hydrochlorothiazide (PRINZIDE,ZESTORETIC) 20-25 MG tablet; Take 1 tablet by mouth daily.   .. Depression screen [facility] 2/9 08/03/2018 12/16/2017  Decreased Interest 0 0  Down, Depressed, Hopeless 0 0  PHQ - 2 Score 0 0  Altered sleeping 0 -  Tired, decreased energy 0 -  Change in appetite 0 -  Feeling bad or failure about yourself  0 -  Trouble concentrating 0 -  Moving slowly or fidgety/restless 0 -  Suicidal thoughts 0 -  PHQ-9 Score 0 -  Difficult doing work/chores Not difficult at all -   .. Discussed 150 minutes of exercise a week.  Encouraged vitamin D 1000 units and Calcium 1300mg  or 4 servings of dairy a day.  Fasting labs ordered.  Mammogram ordered.  Colonoscopy ordered.  Flu shot done. Discussed shingrix. Will decline for now but consider in the future.   Need to schedule pap smear. Pt declined today.  Restart saxenda. Discussed slow titration up. Follow up in 3 months. Coupon card given.  BP not controlled restart lisinopril/HCTZ.  See After Visit Summary for Counseling Recommendations

## 2018-08-04 ENCOUNTER — Encounter: Payer: Self-pay | Admitting: Physician Assistant

## 2018-08-04 DIAGNOSIS — E785 Hyperlipidemia, unspecified: Secondary | ICD-10-CM | POA: Insufficient documentation

## 2018-08-04 LAB — COMPLETE METABOLIC PANEL WITH GFR
AG Ratio: 1.3 (calc) (ref 1.0–2.5)
ALT: 16 U/L (ref 6–29)
AST: 14 U/L (ref 10–35)
Albumin: 4 g/dL (ref 3.6–5.1)
Alkaline phosphatase (APISO): 97 U/L (ref 33–130)
BUN: 17 mg/dL (ref 7–25)
CO2: 31 mmol/L (ref 20–32)
Calcium: 8.9 mg/dL (ref 8.6–10.4)
Chloride: 106 mmol/L (ref 98–110)
Creat: 0.78 mg/dL (ref 0.50–1.05)
GFR, Est African American: 103 mL/min/{1.73_m2} (ref >=60)
GFR, Est Non African American: 89 mL/min/{1.73_m2} (ref >=60)
Globulin: 3.2 g/dL (calc) (ref 1.9–3.7)
Glucose, Bld: 92 mg/dL (ref 65–139)
Potassium: 3.6 mmol/L (ref 3.5–5.3)
Sodium: 143 mmol/L (ref 135–146)
Total Bilirubin: 0.4 mg/dL (ref 0.2–1.2)
Total Protein: 7.2 g/dL (ref 6.1–8.1)

## 2018-08-04 LAB — LIPID PANEL W/REFLEX DIRECT LDL
Cholesterol: 239 mg/dL — ABNORMAL HIGH (ref <200)
HDL: 65 mg/dL (ref >50)
LDL Cholesterol (Calc): 139 mg/dL (calc) — ABNORMAL HIGH
Non-HDL Cholesterol (Calc): 174 mg/dL (calc) — ABNORMAL HIGH (ref <130)
Total CHOL/HDL Ratio: 3.7 (calc) (ref <5.0)
Triglycerides: 212 mg/dL — ABNORMAL HIGH (ref <150)

## 2018-08-04 LAB — TSH: TSH: 1.5 mIU/L

## 2018-08-04 NOTE — Progress Notes (Signed)
Call pt: Tammy Reed, Tammy Reed, glucose looks good. Thyroid normal. HDL great. TG are up you do need to consider fish oil 4000mg , low sugar and carb diet. LDL not to goal but not terrible with no risk factors. Work on weight loss and will recheck.   [name] please fill in the form she left the other day and fax as well as scan and alert patient when been faxed.

## 2018-08-10 ENCOUNTER — Other Ambulatory Visit: Payer: Self-pay | Admitting: Physician Assistant

## 2018-08-10 ENCOUNTER — Telehealth: Payer: Self-pay

## 2018-08-10 DIAGNOSIS — I10 Essential (primary) hypertension: Secondary | ICD-10-CM

## 2018-08-10 MED ORDER — LISINOPRIL-HYDROCHLOROTHIAZIDE 20-25 MG PO TABS: 1.0000 | ORAL_TABLET | Freq: Every day | ORAL | 1 refills | Status: DC

## 2018-08-10 NOTE — Telephone Encounter (Signed)
PT needs refill on BP medication Lisinopril-hydrochlorothiazide. PT [facility] [address]

## 2018-08-10 NOTE — Telephone Encounter (Signed)
Has not been written since 2017. Do not see HTN on last OV note. Routing.

## 2018-08-10 NOTE — Telephone Encounter (Signed)
Sorry I sent. I addended note. Remembered talking about it but did not send rx. Done now.

## 2018-08-10 NOTE — Addendum Note (Signed)
Addended byJomarie Longs: BREEBACK, JADE L on: 08/10/2018 05:00 PM   Modules accepted: Orders

## 2018-08-11 NOTE — Telephone Encounter (Signed)
Left VM with update.  

## 2018-08-25 ENCOUNTER — Ambulatory Visit: Payer: BLUE CROSS/BLUE SHIELD | Admitting: Physician Assistant

## 2018-09-03 ENCOUNTER — Encounter: Payer: Self-pay | Admitting: Physician Assistant

## 2019-09-17 DIAGNOSIS — Z20828 Contact with and (suspected) exposure to other viral communicable diseases: Secondary | ICD-10-CM | POA: Diagnosis not present

## 2019-12-17 DIAGNOSIS — Z20822 Contact with and (suspected) exposure to covid-19: Secondary | ICD-10-CM | POA: Diagnosis not present

## 2020-01-19 ENCOUNTER — Ambulatory Visit (INDEPENDENT_AMBULATORY_CARE_PROVIDER_SITE_OTHER): Payer: BC Managed Care – PPO | Admitting: Physician Assistant

## 2020-01-19 ENCOUNTER — Other Ambulatory Visit: Payer: Self-pay

## 2020-01-19 ENCOUNTER — Encounter: Payer: Self-pay | Admitting: Physician Assistant

## 2020-01-19 VITALS — BP 180/100 | HR 69 | Ht 65.0 in | Wt 222.0 lb

## 2020-01-19 DIAGNOSIS — Z6836 Body mass index (BMI) 36.0-36.9, adult: Secondary | ICD-10-CM

## 2020-01-19 DIAGNOSIS — L7211 Pilar cyst: Secondary | ICD-10-CM

## 2020-01-19 DIAGNOSIS — K5901 Slow transit constipation: Secondary | ICD-10-CM

## 2020-01-19 DIAGNOSIS — Z1231 Encounter for screening mammogram for malignant neoplasm of breast: Secondary | ICD-10-CM

## 2020-01-19 DIAGNOSIS — I10 Essential (primary) hypertension: Secondary | ICD-10-CM

## 2020-01-19 DIAGNOSIS — Z1211 Encounter for screening for malignant neoplasm of colon: Secondary | ICD-10-CM

## 2020-01-19 DIAGNOSIS — H6121 Impacted cerumen, right ear: Secondary | ICD-10-CM

## 2020-01-19 DIAGNOSIS — Z131 Encounter for screening for diabetes mellitus: Secondary | ICD-10-CM

## 2020-01-19 DIAGNOSIS — Z23 Encounter for immunization: Secondary | ICD-10-CM | POA: Diagnosis not present

## 2020-01-19 DIAGNOSIS — H6123 Impacted cerumen, bilateral: Secondary | ICD-10-CM | POA: Insufficient documentation

## 2020-01-19 DIAGNOSIS — Z13220 Encounter for screening for lipoid disorders: Secondary | ICD-10-CM

## 2020-01-19 DIAGNOSIS — Z0000 Encounter for general adult medical examination without abnormal findings: Secondary | ICD-10-CM | POA: Diagnosis not present

## 2020-01-19 DIAGNOSIS — E669 Obesity, unspecified: Secondary | ICD-10-CM

## 2020-01-19 MED ORDER — LISINOPRIL-HYDROCHLOROTHIAZIDE 20-25 MG PO TABS: 1.0000 | ORAL_TABLET | Freq: Every day | ORAL | 0 refills | Status: DC

## 2020-01-19 MED ORDER — LINACLOTIDE 145 MCG PO CAPS: 145.0000 ug | ORAL_CAPSULE | Freq: Every day | ORAL | 3 refills | Status: DC

## 2020-01-19 NOTE — Progress Notes (Signed)
Subjective:     Tammy Reed is a 54 y.o. female and is here for a comprehensive physical exam. The patient reports problems - she has a firm knot on her right side of head. not painful. .   Social History   Socioeconomic History  . Marital status: Single    Spouse name: Not on file  . Number of children: Not on file  . Years of education: Not on file  . Highest education level: Not on file  Occupational History  . Not on file  Tobacco Use  . Smoking status: Never Smoker  . Smokeless tobacco: Never Used  Substance and Sexual Activity  . Alcohol use: No  . Drug use: No  . Sexual activity: Yes    Birth control/protection: I.U.D.  Other Topics Concern  . Not on file  Social History Narrative  . Not on file   Social Determinants of Health   Financial Resource Strain:   . Difficulty of Paying Living Expenses: Not on file  Food Insecurity:   . Worried About Programme researcher, broadcasting/film/video in the Last Year: Not on file  . Ran Out of Food in the Last Year: Not on file  Transportation Needs:   . Lack of Transportation (Medical): Not on file  . Lack of Transportation (Non-Medical): Not on file  Physical Activity:   . Days of Exercise per Week: Not on file  . Minutes of Exercise per Session: Not on file  Stress:   . Feeling of Stress : Not on file  Social Connections:   . Frequency of Communication with Friends and Family: Not on file  . Frequency of Social Gatherings with Friends and Family: Not on file  . Attends Religious Services: Not on file  . Active Member of Clubs or Organizations: Not on file  . Attends Banker Meetings: Not on file  . Marital Status: Not on file  Intimate Partner Violence:   . Fear of Current or Ex-Partner: Not on file  . Emotionally Abused: Not on file  . Physically Abused: Not on file  . Sexually Abused: Not on file   Health Maintenance  Topic Date Due  . HIV Screening  01/12/1983  . PAP SMEAR-Modifier  06/03/2015  . MAMMOGRAM  01/12/2018   . COLONOSCOPY  01/12/2018  . INFLUENZA VACCINE  02/16/2020 (Originally 06/19/2019)  . TETANUS/TDAP  07/23/2022    The following portions of the patient's history were reviewed and updated as appropriate: allergies, current medications, past family history, past medical history, past social history, past surgical history and problem list.  Review of Systems Pertinent items noted in HPI and remainder of comprehensive ROS otherwise negative.   Objective:    BP (!) 180/100   Pulse 69   Ht 5\' 5"  (1.651 m)   Wt 222 lb (100.7 kg)   SpO2 100%   BMI 36.94 kg/m  General appearance: alert, cooperative, appears stated age and moderately obese Head: Normocephalic, without obvious abnormality, atraumatic Eyes: conjunctivae/corneas clear. PERRL, EOM's intact. Fundi benign. Ears: normal TM's and external ear canals both ears Nose: Nares normal. Septum midline. Mucosa normal. No drainage or sinus tenderness. Throat: lips, mucosa, and tongue normal; teeth and gums normal Neck: no adenopathy, no carotid bruit, no JVD, supple, symmetrical, trachea midline and thyroid not enlarged, symmetric, no tenderness/mass/nodules Back: symmetric, no curvature. ROM normal. No CVA tenderness. Lungs: clear to auscultation bilaterally Heart: regular rate and rhythm, S1, S2 normal, no murmur, click, rub or gallop Abdomen: soft, non-tender;  bowel sounds normal; no masses,  no organomegaly Extremities: extremities normal, atraumatic, no cyanosis or edema Pulses: 2+ and symmetric Skin: Skin color, texture, turgor normal. No rashes or lesions or firm mobile nodule of right mid scalp. non tender. not red or swollen.  Lymph nodes: Cervical, supraclavicular, and axillary nodes normal. Neurologic: Alert and oriented X 3, normal strength and tone. Normal symmetric reflexes. Normal coordination and gait    Assessment:    Healthy female exam.      Plan:    Marland KitchenMarland KitchenJennea was seen today for annual exam.  Diagnoses and all  orders for this visit:  Routine physical examination  Essential hypertension, benign -     COMPLETE METABOLIC PANEL WITH GFR -     CBC -     lisinopril-hydrochlorothiazide (ZESTORETIC) 20-25 MG tablet; Take 1 tablet by mouth daily.  Class 2 obesity without serious comorbidity with body mass index (BMI) of 36.0 to 36.9 in adult, unspecified obesity type -     TSH  Screening for lipid disorders -     Lipid Panel w/reflex Direct LDL  Visit for screening mammogram -     MM 3D SCREEN BREAST BILATERAL  Colon cancer screening -     Ambulatory referral to Gastroenterology  Screening for diabetes mellitus -     COMPLETE METABOLIC PANEL WITH GFR  Right ear impacted cerumen  Need for shingles vaccine -     Varicella-zoster vaccine IM  Slow transit constipation -     linaclotide (LINZESS) 145 MCG CAPS capsule; Take 1 capsule (145 mcg total) by mouth daily before breakfast.  Pilar cyst  .. Depression screen [facility] 2/9 01/19/2020 08/03/2018 12/16/2017  Decreased Interest 0 0 0  Down, Depressed, Hopeless 0 0 0  PHQ - 2 Score 0 0 0  Altered sleeping 0 0 -  Tired, decreased energy 0 0 -  Change in appetite 0 0 -  Feeling bad or failure about yourself  0 0 -  Trouble concentrating 0 0 -  Moving slowly or fidgety/restless 0 0 -  Suicidal thoughts 0 0 -  PHQ-9 Score 0 0 -  Difficult doing work/chores Not difficult at all Not difficult at all -    Discussed 150 minutes of exercise a week.  Encouraged vitamin D 1000 units and Calcium 1300mg  or 4 servings of dairy a day.  Fasting labs ordered.  Declined Pap and STD testing.  Ordered colonoscopy and mammogram. Started shingrix series. Follow up in 2 months.   BP is terrible. You MUST start your medication and follow up in 2 weeks for recheck.   Discussed pilar cyst. Can return to Dr. Darene Lamer or dermatology referral for removal. Discussed benign nature. HO given.    See After Visit Summary for Counseling Recommendations

## 2020-01-19 NOTE — Patient Instructions (Signed)
Health Maintenance, Female Adopting a healthy lifestyle and getting preventive care are important in promoting health and wellness. Ask your health care provider about:  The right schedule for you to have regular tests and exams.  Things you can do on your own to prevent diseases and keep yourself healthy. What should I know about diet, weight, and exercise? Eat a healthy diet   Eat a diet that includes plenty of vegetables, fruits, low-fat dairy products, and lean protein.  Do not eat a lot of foods that are high in solid fats, added sugars, or sodium. Maintain a healthy weight Body mass index (BMI) is used to identify weight problems. It estimates body fat based on height and weight. Your health care provider can help determine your BMI and help you achieve or maintain a healthy weight. Get regular exercise Get regular exercise. This is one of the most important things you can do for your health. Most adults should:  Exercise for at least 150 minutes each week. The exercise should increase your heart rate and make you sweat (moderate-intensity exercise).  Do strengthening exercises at least twice a week. This is in addition to the moderate-intensity exercise.  Spend less time sitting. Even light physical activity can be beneficial. Watch cholesterol and blood lipids Have your blood tested for lipids and cholesterol at 52 years of age, then have this test every 5 years. Have your cholesterol levels checked more often if:  Your lipid or cholesterol levels are high.  You are older than 52 years of age.  You are at high risk for heart disease. What should I know about cancer screening? Depending on your health history and family history, you may need to have cancer screening at various ages. This may include screening for:  Breast cancer.  Cervical cancer.  Colorectal cancer.  Skin cancer.  Lung cancer. What should I know about heart disease, diabetes, and high blood  pressure? Blood pressure and heart disease  High blood pressure causes heart disease and increases the risk of stroke. This is more likely to develop in people who have high blood pressure readings, are of African descent, or are overweight.  Have your blood pressure checked: ? Every 3-5 years if you are 18-39 years of age. ? Every year if you are 40 years old or older. Diabetes Have regular diabetes screenings. This checks your fasting blood sugar level. Have the screening done:  Once every three years after age 40 if you are at a normal weight and have a low risk for diabetes.  More often and at a younger age if you are overweight or have a high risk for diabetes. What should I know about preventing infection? Hepatitis B If you have a higher risk for hepatitis B, you should be screened for this virus. Talk with your health care provider to find out if you are at risk for hepatitis B infection. Hepatitis C Testing is recommended for:  Everyone born from 1945 through 1965.  Anyone with known risk factors for hepatitis C. Sexually transmitted infections (STIs)  Get screened for STIs, including gonorrhea and chlamydia, if: ? You are sexually active and are younger than 52 years of age. ? You are older than 52 years of age and your health care provider tells you that you are at risk for this type of infection. ? Your sexual activity has changed since you were last screened, and you are at increased risk for chlamydia or gonorrhea. Ask your health care provider if   you are at risk.  Ask your health care provider about whether you are at high risk for HIV. Your health care provider may recommend a prescription medicine to help prevent HIV infection. If you choose to take medicine to prevent HIV, you should first get tested for HIV. You should then be tested every 3 months for as long as you are taking the medicine. Pregnancy  If you are about to stop having your period (premenopausal) and  you may become pregnant, seek counseling before you get pregnant.  Take 400 to 800 micrograms (mcg) of folic acid every day if you become pregnant.  Ask for birth control (contraception) if you want to prevent pregnancy. Osteoporosis and menopause Osteoporosis is a disease in which the bones lose minerals and strength with aging. This can result in bone fractures. If you are 65 years old or older, or if you are at risk for osteoporosis and fractures, ask your health care provider if you should:  Be screened for bone loss.  Take a calcium or vitamin D supplement to lower your risk of fractures.  Be given hormone replacement therapy (HRT) to treat symptoms of menopause. Follow these instructions at home: Lifestyle  Do not use any products that contain nicotine or tobacco, such as cigarettes, e-cigarettes, and chewing tobacco. If you need help quitting, ask your health care provider.  Do not use street drugs.  Do not share needles.  Ask your health care provider for help if you need support or information about quitting drugs. Alcohol use  Do not drink alcohol if: ? Your health care provider tells you not to drink. ? You are pregnant, may be pregnant, or are planning to become pregnant.  If you drink alcohol: ? Limit how much you use to 0-1 drink a day. ? Limit intake if you are breastfeeding.  Be aware of how much alcohol is in your drink. In the [place] one drink equals one 12 oz bottle of beer (355 mL), one 5 oz glass of wine (148 mL), or one 1 oz glass of hard liquor (44 mL). General instructions  Schedule regular health, dental, and eye exams.  Stay current with your vaccines.  Tell your health care provider if: ? You often feel depressed. ? You have ever been abused or do not feel safe at home. Summary  Adopting a healthy lifestyle and getting preventive care are important in promoting health and wellness.  Follow your health care provider's instructions about healthy  diet, exercising, and getting tested or screened for diseases.  Follow your health care provider's instructions on monitoring your cholesterol and blood pressure. This information is not intended to replace advice given to you by your health care provider. Make sure you discuss any questions you have with your health care provider. Document Revised: 10/28/2018 Document Reviewed: 10/28/2018 Elsevier Patient Education  2020 [employer] Inc[employer]  

## 2020-01-20 ENCOUNTER — Ambulatory Visit (INDEPENDENT_AMBULATORY_CARE_PROVIDER_SITE_OTHER): Payer: BC Managed Care – PPO

## 2020-01-20 ENCOUNTER — Other Ambulatory Visit: Payer: Self-pay

## 2020-01-20 DIAGNOSIS — I10 Essential (primary) hypertension: Secondary | ICD-10-CM | POA: Diagnosis not present

## 2020-01-20 DIAGNOSIS — Z1231 Encounter for screening mammogram for malignant neoplasm of breast: Secondary | ICD-10-CM | POA: Diagnosis not present

## 2020-01-20 DIAGNOSIS — Z13220 Encounter for screening for lipoid disorders: Secondary | ICD-10-CM | POA: Diagnosis not present

## 2020-01-20 DIAGNOSIS — Z6836 Body mass index (BMI) 36.0-36.9, adult: Secondary | ICD-10-CM | POA: Diagnosis not present

## 2020-01-20 DIAGNOSIS — Z131 Encounter for screening for diabetes mellitus: Secondary | ICD-10-CM | POA: Diagnosis not present

## 2020-01-20 LAB — COMPLETE METABOLIC PANEL WITH GFR
AG Ratio: 1.1 (calc) (ref 1.0–2.5)
ALT: 17 U/L (ref 6–29)
AST: 14 U/L (ref 10–35)
Albumin: 3.9 g/dL (ref 3.6–5.1)
Alkaline phosphatase (APISO): 97 U/L (ref 37–153)
BUN: 18 mg/dL (ref 7–25)
CO2: 32 mmol/L (ref 20–32)
Calcium: 9.1 mg/dL (ref 8.6–10.4)
Chloride: 101 mmol/L (ref 98–110)
Creat: 0.83 mg/dL (ref 0.50–1.05)
GFR, Est African American: 94 mL/min/{1.73_m2} (ref >=60)
GFR, Est Non African American: 81 mL/min/{1.73_m2} (ref >=60)
Globulin: 3.5 g/dL (calc) (ref 1.9–3.7)
Glucose, Bld: 76 mg/dL (ref 65–99)
Potassium: 3.4 mmol/L — ABNORMAL LOW (ref 3.5–5.3)
Sodium: 140 mmol/L (ref 135–146)
Total Bilirubin: 0.7 mg/dL (ref 0.2–1.2)
Total Protein: 7.4 g/dL (ref 6.1–8.1)

## 2020-01-20 LAB — CBC
HCT: 39.8 % (ref 35.0–45.0)
Hemoglobin: 12.9 g/dL (ref 11.7–15.5)
MCH: 26.7 pg — ABNORMAL LOW (ref 27.0–33.0)
MCHC: 32.4 g/dL (ref 32.0–36.0)
MCV: 82.4 fL (ref 80.0–100.0)
MPV: 9.8 fL (ref 7.5–12.5)
Platelets: 382 10*3/uL (ref 140–400)
RBC: 4.83 10*6/uL (ref 3.80–5.10)
RDW: 13.6 % (ref 11.0–15.0)
WBC: 9 10*3/uL (ref 3.8–10.8)

## 2020-01-20 LAB — LIPID PANEL W/REFLEX DIRECT LDL
Cholesterol: 255 mg/dL — ABNORMAL HIGH (ref <200)
HDL: 70 mg/dL (ref >=50)
LDL Cholesterol (Calc): 165 mg/dL (calc) — ABNORMAL HIGH
Non-HDL Cholesterol (Calc): 185 mg/dL (calc) — ABNORMAL HIGH (ref <130)
Total CHOL/HDL Ratio: 3.6 (calc) (ref <5.0)
Triglycerides: 90 mg/dL (ref <150)

## 2020-01-20 LAB — TSH: TSH: 0.89 mIU/L

## 2020-01-21 ENCOUNTER — Encounter: Payer: Self-pay | Admitting: Physician Assistant

## 2020-01-21 DIAGNOSIS — L7211 Pilar cyst: Secondary | ICD-10-CM | POA: Insufficient documentation

## 2020-01-21 DIAGNOSIS — E876 Hypokalemia: Secondary | ICD-10-CM | POA: Insufficient documentation

## 2020-01-21 NOTE — Progress Notes (Signed)
Cholesterol is elevated. We need to start a cholesterol lowering drug called statin. Are you willing to take this daily?  Potassium is a hair low. Will recheck in 2 weeks at BP recheck appt.

## 2020-01-24 NOTE — Progress Notes (Signed)
Call pt: normal mammogram. Follow up in 1 year.

## 2020-01-25 ENCOUNTER — Telehealth: Payer: Self-pay | Admitting: Physician Assistant

## 2020-01-25 NOTE — Telephone Encounter (Signed)
Received fax for PA on Linzess sent through cover my meds and received authorization.   Valid 01/25/20 - 01/23/21 - CF

## 2020-01-27 ENCOUNTER — Encounter: Payer: Self-pay | Admitting: Neurology

## 2020-01-28 ENCOUNTER — Telehealth: Payer: Self-pay | Admitting: Neurology

## 2020-01-28 MED ORDER — ATORVASTATIN CALCIUM 40 MG PO TABS: 40.0000 mg | ORAL_TABLET | Freq: Every day | ORAL | 3 refills | Status: DC

## 2020-01-28 NOTE — Telephone Encounter (Signed)
Done

## 2020-01-28 NOTE — Telephone Encounter (Signed)
Patient called back and left vm that she is okay with starting Statin. Please send to pharmacy on file.

## 2020-02-02 ENCOUNTER — Other Ambulatory Visit: Payer: Self-pay

## 2020-02-02 ENCOUNTER — Ambulatory Visit (INDEPENDENT_AMBULATORY_CARE_PROVIDER_SITE_OTHER): Payer: BC Managed Care – PPO | Admitting: Physician Assistant

## 2020-02-02 ENCOUNTER — Encounter: Payer: Self-pay | Admitting: Physician Assistant

## 2020-02-02 DIAGNOSIS — I10 Essential (primary) hypertension: Secondary | ICD-10-CM | POA: Diagnosis not present

## 2020-02-02 NOTE — Progress Notes (Signed)
Pt doing well. Denies any SOB,CP or palpitations.  She has lost 17lbs since her last OV. Laureen Ochs, Viann Shove, CMA  BP is much better. Stay at this dose and recheck in 3 months.  GREAT job on weight loss. Keep it up.  Tandy Gaw PA_C

## 2020-02-17 ENCOUNTER — Ambulatory Visit: Payer: BC Managed Care – PPO

## 2020-02-21 ENCOUNTER — Ambulatory Visit: Payer: BC Managed Care – PPO

## 2020-03-22 ENCOUNTER — Encounter: Payer: Self-pay | Admitting: Physician Assistant

## 2020-03-29 ENCOUNTER — Ambulatory Visit: Payer: BC Managed Care – PPO

## 2020-12-23 IMAGING — MG DIGITAL SCREENING BILAT W/ TOMO W/ CAD
6 of 12 series · 6 of 36 positions shown · non-contrast
Comparison: None.

CLINICAL DATA: Screening.

EXAM:
DIGITAL SCREENING BILATERAL MAMMOGRAM WITH TOMO AND CAD

[L CC synth-2D]
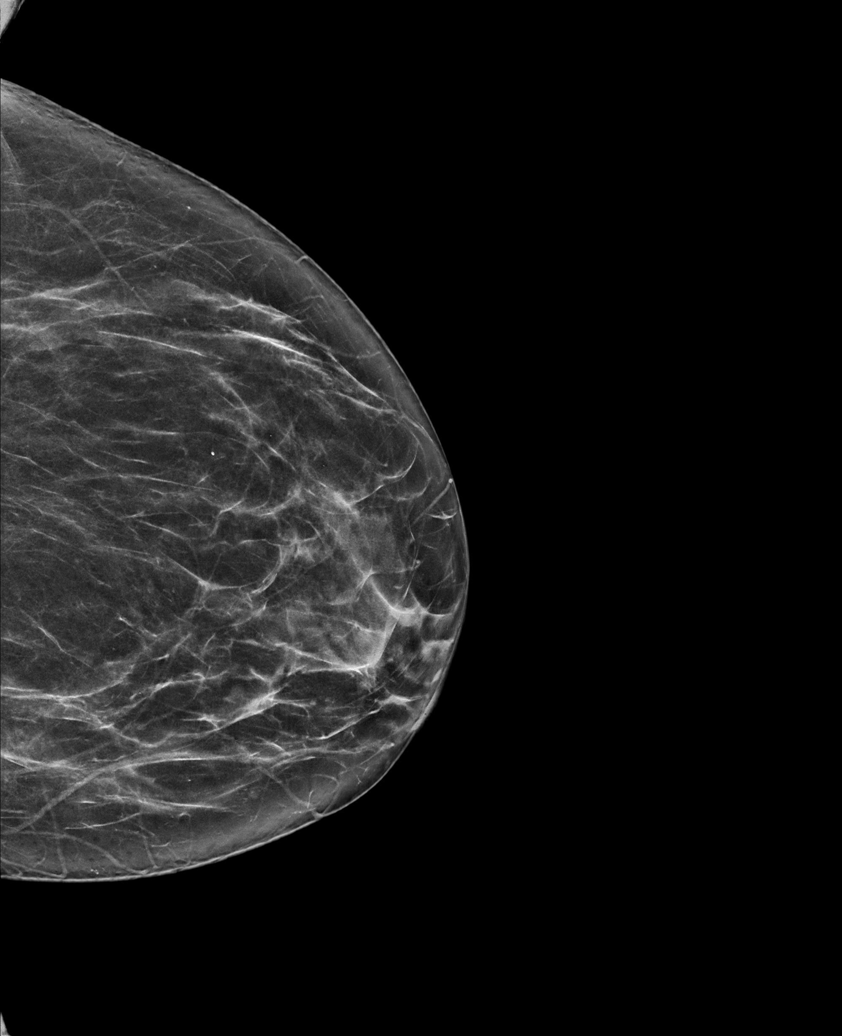

[L MLO synth-2D (1 of 2)]
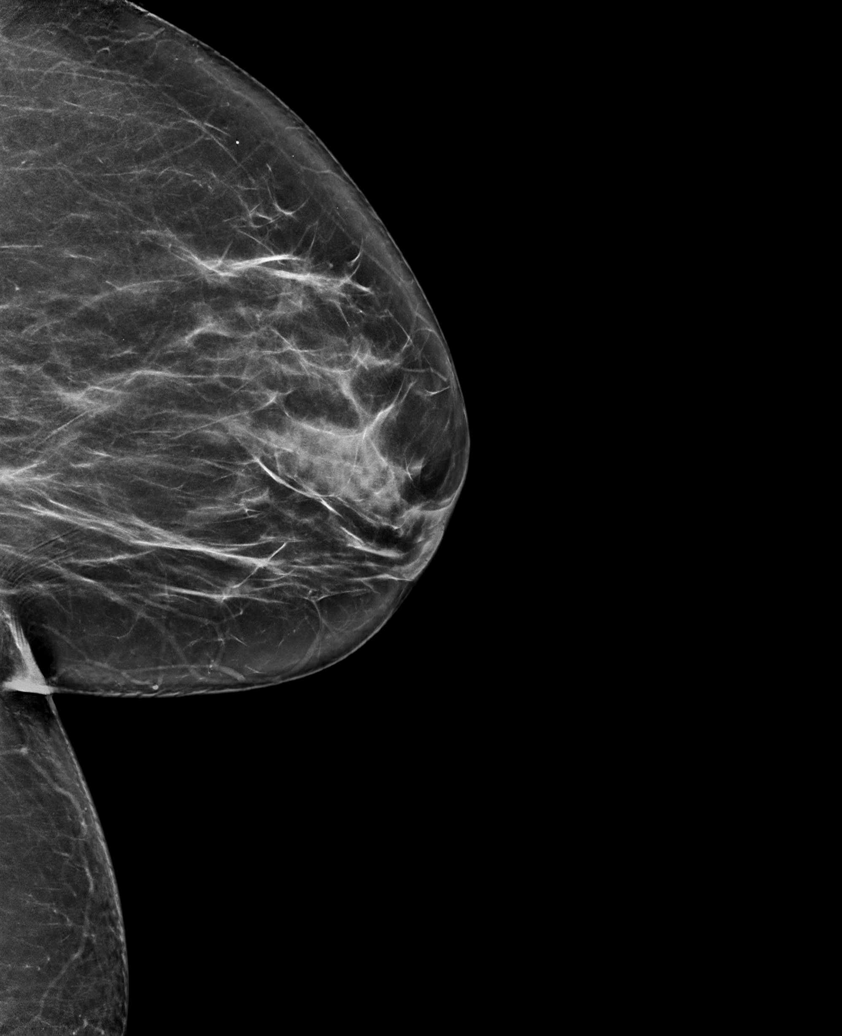

[R XCCM synth-2D]
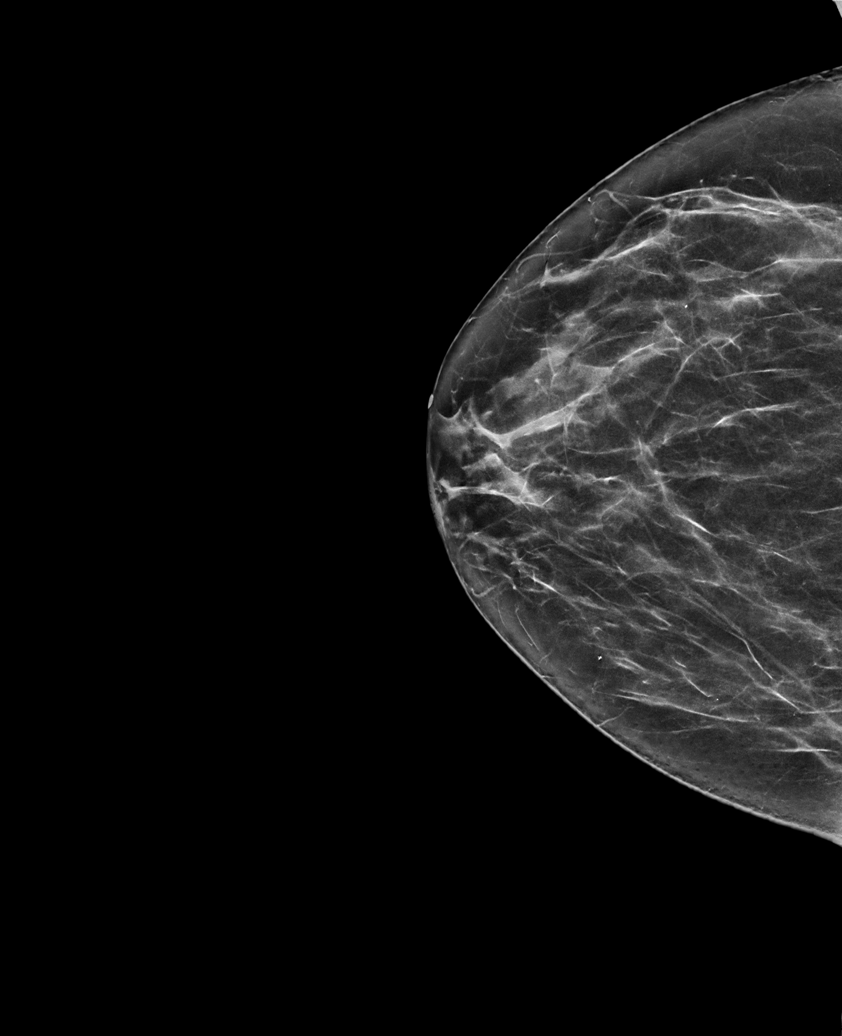

[R CC synth-2D]
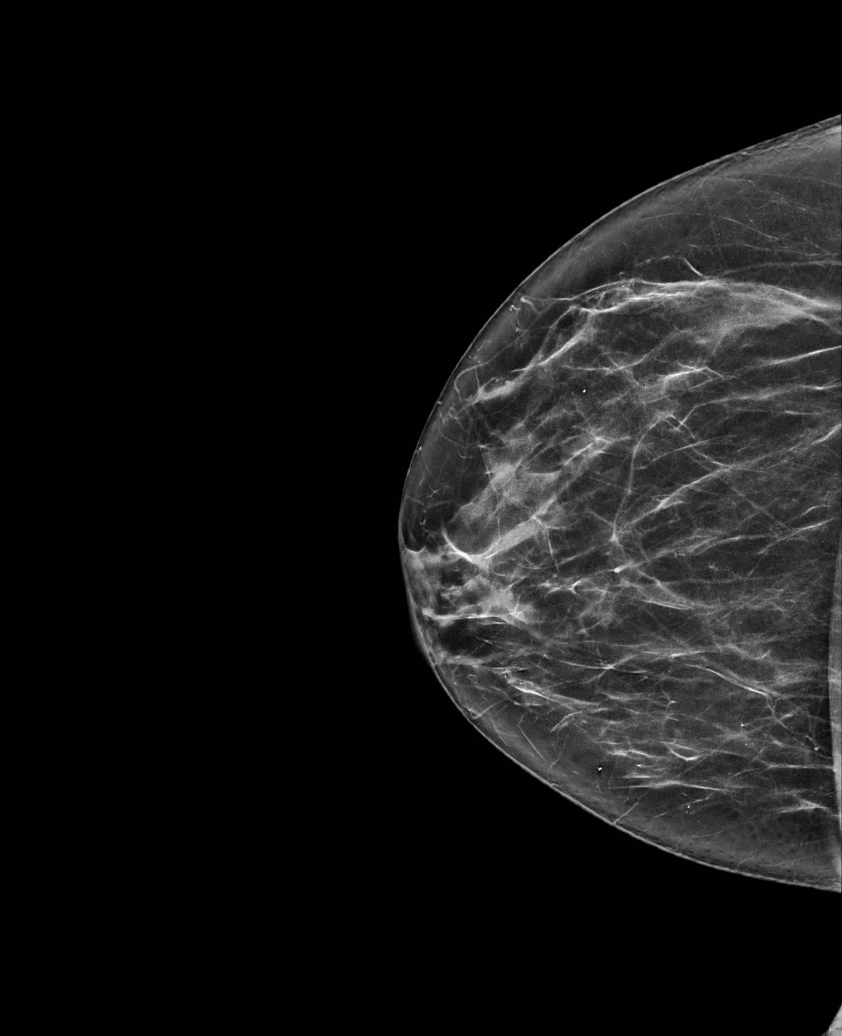

[L MLO synth-2D (2 of 2)]
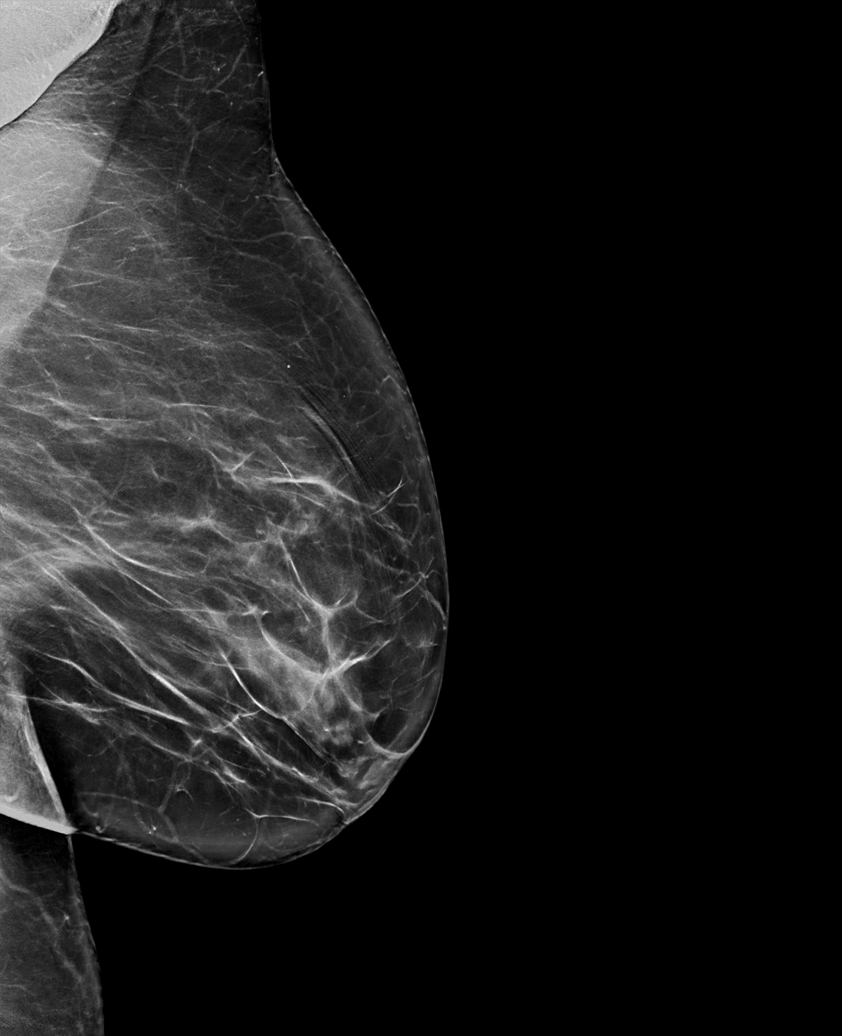

[R MLO synth-2D]
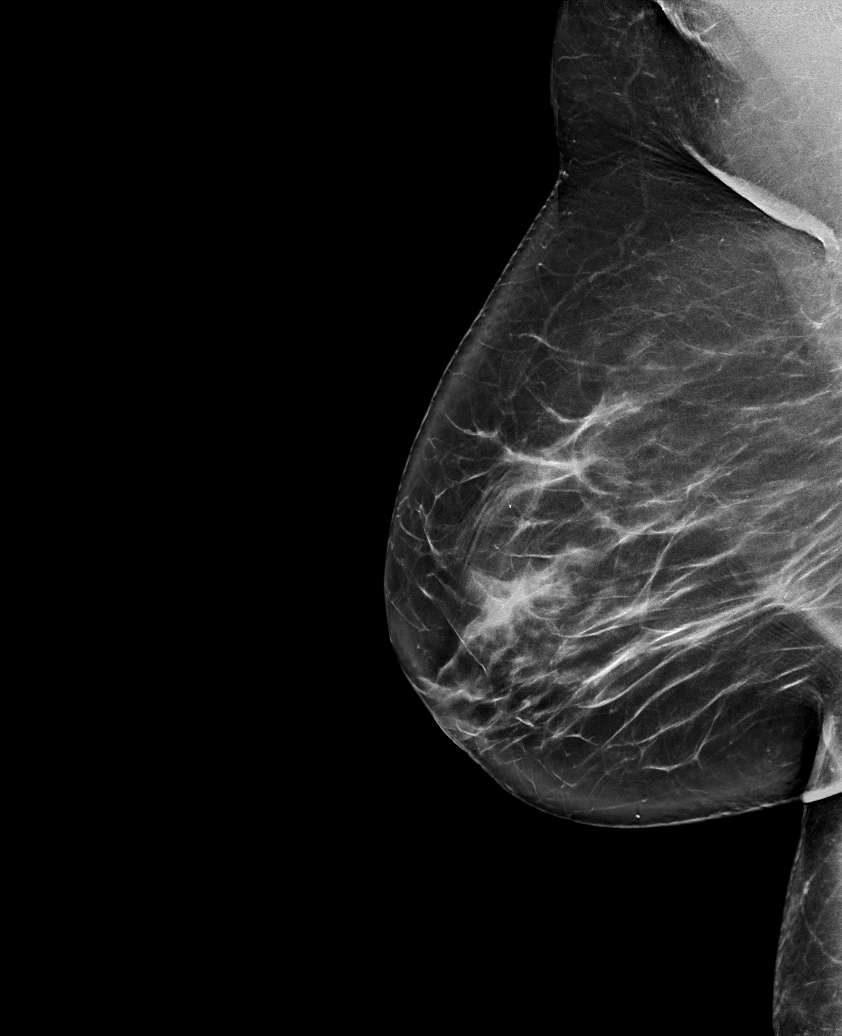

[6 of 36 positions shown; findings below may reference images not displayed]

ACR Breast Density Category b: There are scattered areas of
fibroglandular density.
FINDINGS: There are no findings suspicious for malignancy. Images were
processed with CAD.
IMPRESSION: No mammographic evidence of malignancy. A result letter of this
screening mammogram will be mailed directly to the patient.

RECOMMENDATION:
Screening mammogram in one year. (Code:[id number])

BI-RADS CATEGORY  1: Negative.

## 2021-02-05 DIAGNOSIS — H2513 Age-related nuclear cataract, bilateral: Secondary | ICD-10-CM | POA: Diagnosis not present

## 2021-02-05 DIAGNOSIS — H40033 Anatomical narrow angle, bilateral: Secondary | ICD-10-CM | POA: Diagnosis not present

## 2021-02-06 ENCOUNTER — Encounter: Payer: BC Managed Care – PPO | Admitting: Physician Assistant

## 2021-02-06 DIAGNOSIS — Z13220 Encounter for screening for lipoid disorders: Secondary | ICD-10-CM

## 2021-02-06 DIAGNOSIS — Z131 Encounter for screening for diabetes mellitus: Secondary | ICD-10-CM

## 2021-02-06 DIAGNOSIS — Z1329 Encounter for screening for other suspected endocrine disorder: Secondary | ICD-10-CM

## 2021-02-06 DIAGNOSIS — Z0000 Encounter for general adult medical examination without abnormal findings: Secondary | ICD-10-CM

## 2021-02-06 DIAGNOSIS — I10 Essential (primary) hypertension: Secondary | ICD-10-CM

## 2021-02-14 ENCOUNTER — Ambulatory Visit: Payer: BC Managed Care – PPO | Admitting: Physician Assistant

## 2021-02-14 ENCOUNTER — Other Ambulatory Visit (HOSPITAL_COMMUNITY)
Admission: RE | Admit: 2021-02-14 | Discharge: 2021-02-14 | Disposition: A | Discharge status: Home / Self Care | Payer: BC Managed Care – PPO | Source: Ambulatory Visit | Attending: Physician Assistant | Admitting: Physician Assistant

## 2021-02-14 ENCOUNTER — Other Ambulatory Visit: Payer: Self-pay

## 2021-02-14 VITALS — BP 154/71 | HR 69 | Ht 65.0 in | Wt 214.0 lb

## 2021-02-14 DIAGNOSIS — Z13220 Encounter for screening for lipoid disorders: Secondary | ICD-10-CM | POA: Diagnosis not present

## 2021-02-14 DIAGNOSIS — E782 Mixed hyperlipidemia: Secondary | ICD-10-CM

## 2021-02-14 DIAGNOSIS — K5901 Slow transit constipation: Secondary | ICD-10-CM

## 2021-02-14 DIAGNOSIS — Z23 Encounter for immunization: Secondary | ICD-10-CM | POA: Diagnosis not present

## 2021-02-14 DIAGNOSIS — Z0000 Encounter for general adult medical examination without abnormal findings: Secondary | ICD-10-CM | POA: Diagnosis not present

## 2021-02-14 DIAGNOSIS — E6609 Other obesity due to excess calories: Secondary | ICD-10-CM

## 2021-02-14 DIAGNOSIS — Z1159 Encounter for screening for other viral diseases: Secondary | ICD-10-CM | POA: Diagnosis not present

## 2021-02-14 DIAGNOSIS — Z124 Encounter for screening for malignant neoplasm of cervix: Secondary | ICD-10-CM | POA: Insufficient documentation

## 2021-02-14 DIAGNOSIS — Z1329 Encounter for screening for other suspected endocrine disorder: Secondary | ICD-10-CM

## 2021-02-14 DIAGNOSIS — I10 Essential (primary) hypertension: Secondary | ICD-10-CM

## 2021-02-14 DIAGNOSIS — Z131 Encounter for screening for diabetes mellitus: Secondary | ICD-10-CM | POA: Diagnosis not present

## 2021-02-14 DIAGNOSIS — Z1211 Encounter for screening for malignant neoplasm of colon: Secondary | ICD-10-CM

## 2021-02-14 DIAGNOSIS — Z1231 Encounter for screening mammogram for malignant neoplasm of breast: Secondary | ICD-10-CM

## 2021-02-14 DIAGNOSIS — Z6835 Body mass index (BMI) 35.0-35.9, adult: Secondary | ICD-10-CM

## 2021-02-14 MED ORDER — ATORVASTATIN CALCIUM 40 MG PO TABS: 40.0000 mg | ORAL_TABLET | Freq: Every day | ORAL | 3 refills | Status: DC

## 2021-02-14 MED ORDER — LINACLOTIDE 145 MCG PO CAPS: 145.0000 ug | ORAL_CAPSULE | Freq: Every day | ORAL | 3 refills | Status: DC

## 2021-02-14 MED ORDER — LISINOPRIL-HYDROCHLOROTHIAZIDE 20-25 MG PO TABS
1.0000 | ORAL_TABLET | Freq: Every day | ORAL | 3 refills | Status: DC
Start: 2021-02-14 — End: 2021-03-28

## 2021-02-14 NOTE — Patient Instructions (Signed)
Health Maintenance, Female Adopting a healthy lifestyle and getting preventive care are important in promoting health and wellness. Ask your health care provider about:  The right schedule for you to have regular tests and exams.  Things you can do on your own to prevent diseases and keep yourself healthy. What should I know about diet, weight, and exercise? Eat a healthy diet  Eat a diet that includes plenty of vegetables, fruits, low-fat dairy products, and lean protein.  Do not eat a lot of foods that are high in solid fats, added sugars, or sodium.   Maintain a healthy weight Body mass index (BMI) is used to identify weight problems. It estimates body fat based on height and weight. Your health care provider can help determine your BMI and help you achieve or maintain a healthy weight. Get regular exercise Get regular exercise. This is one of the most important things you can do for your health. Most adults should:  Exercise for at least 150 minutes each week. The exercise should increase your heart rate and make you sweat (moderate-intensity exercise).  Do strengthening exercises at least twice a week. This is in addition to the moderate-intensity exercise.  Spend less time sitting. Even light physical activity can be beneficial. Watch cholesterol and blood lipids Have your blood tested for lipids and cholesterol at 53 years of age, then have this test every 5 years. Have your cholesterol levels checked more often if:  Your lipid or cholesterol levels are high.  You are older than 53 years of age.  You are at high risk for heart disease. What should I know about cancer screening? Depending on your health history and family history, you may need to have cancer screening at various ages. This may include screening for:  Breast cancer.  Cervical cancer.  Colorectal cancer.  Skin cancer.  Lung cancer. What should I know about heart disease, diabetes, and high blood  pressure? Blood pressure and heart disease  High blood pressure causes heart disease and increases the risk of stroke. This is more likely to develop in people who have high blood pressure readings, are of African descent, or are overweight.  Have your blood pressure checked: ? Every 3-5 years if you are 18-39 years of age. ? Every year if you are 40 years old or older. Diabetes Have regular diabetes screenings. This checks your fasting blood sugar level. Have the screening done:  Once every three years after age 40 if you are at a normal weight and have a low risk for diabetes.  More often and at a younger age if you are overweight or have a high risk for diabetes. What should I know about preventing infection? Hepatitis B If you have a higher risk for hepatitis B, you should be screened for this virus. Talk with your health care provider to find out if you are at risk for hepatitis B infection. Hepatitis C Testing is recommended for:  Everyone born from 1945 through 1965.  Anyone with known risk factors for hepatitis C. Sexually transmitted infections (STIs)  Get screened for STIs, including gonorrhea and chlamydia, if: ? You are sexually active and are younger than 53 years of age. ? You are older than 53 years of age and your health care provider tells you that you are at risk for this type of infection. ? Your sexual activity has changed since you were last screened, and you are at increased risk for chlamydia or gonorrhea. Ask your health care provider   if you are at risk.  Ask your health care provider about whether you are at high risk for HIV. Your health care provider may recommend a prescription medicine to help prevent HIV infection. If you choose to take medicine to prevent HIV, you should first get tested for HIV. You should then be tested every 3 months for as long as you are taking the medicine. Pregnancy  If you are about to stop having your period (premenopausal) and  you may become pregnant, seek counseling before you get pregnant.  Take 400 to 800 micrograms (mcg) of folic acid every day if you become pregnant.  Ask for birth control (contraception) if you want to prevent pregnancy. Osteoporosis and menopause Osteoporosis is a disease in which the bones lose minerals and strength with aging. This can result in bone fractures. If you are 65 years old or older, or if you are at risk for osteoporosis and fractures, ask your health care provider if you should:  Be screened for bone loss.  Take a calcium or vitamin D supplement to lower your risk of fractures.  Be given hormone replacement therapy (HRT) to treat symptoms of menopause. Follow these instructions at home: Lifestyle  Do not use any products that contain nicotine or tobacco, such as cigarettes, e-cigarettes, and chewing tobacco. If you need help quitting, ask your health care provider.  Do not use street drugs.  Do not share needles.  Ask your health care provider for help if you need support or information about quitting drugs. Alcohol use  Do not drink alcohol if: ? Your health care provider tells you not to drink. ? You are pregnant, may be pregnant, or are planning to become pregnant.  If you drink alcohol: ? Limit how much you use to 0-1 drink a day. ? Limit intake if you are breastfeeding.  Be aware of how much alcohol is in your drink. In the [place] one drink equals one 12 oz bottle of beer (355 mL), one 5 oz glass of wine (148 mL), or one 1 oz glass of hard liquor (44 mL). General instructions  Schedule regular health, dental, and eye exams.  Stay current with your vaccines.  Tell your health care provider if: ? You often feel depressed. ? You have ever been abused or do not feel safe at home. Summary  Adopting a healthy lifestyle and getting preventive care are important in promoting health and wellness.  Follow your health care provider's instructions about healthy  diet, exercising, and getting tested or screened for diseases.  Follow your health care provider's instructions on monitoring your cholesterol and blood pressure. This information is not intended to replace advice given to you by your health care provider. Make sure you discuss any questions you have with your health care provider. Document Revised: 10/28/2018 Document Reviewed: 10/28/2018 Elsevier Patient Education  2021 Elsevier Inc.  

## 2021-02-14 NOTE — Progress Notes (Signed)
Subjective:     Tammy Reed is a 55 y.o. female and is here for a comprehensive physical exam. The patient reports no problems.  Social History   Socioeconomic History  . Marital status: Single    Spouse name: Not on file  . Number of children: Not on file  . Years of education: Not on file  . Highest education level: Not on file  Occupational History  . Not on file  Tobacco Use  . Smoking status: Never Smoker  . Smokeless tobacco: Never Used  Substance and Sexual Activity  . Alcohol use: No  . Drug use: No  . Sexual activity: Yes    Birth control/protection: I.U.D.  Other Topics Concern  . Not on file  Social History Narrative  . Not on file   Social Determinants of Health   Financial Resource Strain: Not on file  Food Insecurity: Not on file  Transportation Needs: Not on file  Physical Activity: Not on file  Stress: Not on file  Social Connections: Not on file  Intimate Partner Violence: Not on file   Health Maintenance  Topic Date Due  . COLONOSCOPY (Pts 45-39yrs Insurance coverage will need to be confirmed)  Never done  . PAP SMEAR-Modifier  06/03/2015  . COVID-19 Vaccine (3 - Booster for Pfizer series) 03/02/2021 (Originally 02/04/2021)  . HIV Screening  02/14/2022 (Originally [birth year]/1983)  . INFLUENZA VACCINE  06/18/2021  . MAMMOGRAM  01/19/2022  . TETANUS/TDAP  07/23/2022  . Hepatitis C Screening  Completed  . HPV VACCINES  Aged Out    The following portions of the patient's history were reviewed and updated as appropriate: allergies, current medications, past family history, past medical history, past social history, past surgical history and problem list.  Review of Systems A comprehensive review of systems was negative.   Objective:    BP (!) 154/71   Pulse 69   Ht 5\' 5"  (1.651 m)   Wt 214 lb (97.1 kg)   SpO2 98%   BMI 35.61 kg/m  General appearance: alert, cooperative, appears stated age and moderately obese Head: Normocephalic, without obvious  abnormality, atraumatic Eyes: conjunctivae/corneas clear. PERRL, EOM's intact. Fundi benign. Ears: normal TM's and external ear canals both ears Nose: Nares normal. Septum midline. Mucosa normal. No drainage or sinus tenderness. Throat: lips, mucosa, and tongue normal; teeth and gums normal Neck: no adenopathy, no carotid bruit, no JVD, supple, symmetrical, trachea midline and thyroid not enlarged, symmetric, no tenderness/mass/nodules Back: symmetric, no curvature. ROM normal. No CVA tenderness. Lungs: clear to auscultation bilaterally Breasts: normal appearance, no masses or tenderness Heart: regular rate and rhythm, S1, S2 normal, no murmur, click, rub or gallop Abdomen: soft, non-tender; bowel sounds normal; no masses,  no organomegaly Pelvic: cervix normal in appearance, external genitalia normal, no adnexal masses or tenderness, no cervical motion tenderness, uterus normal size, shape, and consistency, vagina normal without discharge and cervical os stenosis Extremities: extremities normal, atraumatic, no cyanosis or edema Pulses: 2+ and symmetric Skin: Skin color, texture, turgor normal. No rashes or lesions Lymph nodes: Cervical, supraclavicular, and axillary nodes normal. Neurologic: Alert and oriented X 3, normal strength and tone. Normal symmetric reflexes. Normal coordination and gait   . Depression screen [facility] 2/9 02/14/2021 01/19/2020 08/03/2018 12/16/2017  Decreased Interest 0 0 0 0  Down, Depressed, Hopeless 0 0 0 0  PHQ - 2 Score 0 0 0 0  Altered sleeping - 0 0 -  Tired, decreased energy - 0 0 -  Change in  appetite - 0 0 -  Feeling bad or failure about yourself  - 0 0 -  Trouble concentrating - 0 0 -  Moving slowly or fidgety/restless - 0 0 -  Suicidal thoughts - 0 0 -  PHQ-9 Score - 0 0 -  Difficult doing work/chores - Not difficult at all Not difficult at all -   Assessment:    Healthy female exam.      Plan:    Marland KitchenMarland KitchenDavia was seen today for annual exam.  Diagnoses  and all orders for this visit:  Routine physical examination -     MM 3D SCREEN BREAST BILATERAL -     Lipid Panel w/reflex Direct LDL -     COMPLETE METABOLIC PANEL WITH GFR -     Hepatitis C Antibody -     CBC -     TSH  Colon cancer screening -     Ambulatory referral to Gastroenterology  Visit for screening mammogram -     MM 3D SCREEN BREAST BILATERAL  Screening for lipid disorders -     Lipid Panel w/reflex Direct LDL  Screening for diabetes mellitus -     COMPLETE METABOLIC PANEL WITH GFR  Encounter for hepatitis C screening test for low risk patient -     Hepatitis C Antibody  Thyroid disorder screen -     TSH  Papanicolaou smear -     Cytology - PAP  Flu vaccine need -     TSH -     Flu Vaccine QUAD 4mo+IM (Fluarix, Fluzone & Alfiuria Quad PF)  Slow transit constipation -     linaclotide (LINZESS) 145 MCG CAPS capsule; Take 1 capsule (145 mcg total) by mouth daily before breakfast.  Essential hypertension, benign -     lisinopril-hydrochlorothiazide (ZESTORETIC) 20-25 MG tablet; Take 1 tablet by mouth daily.  Mixed hyperlipidemia -     atorvastatin (LIPITOR) 40 MG tablet; Take 1 tablet (40 mg total) by mouth daily.   .. Discussed 150 minutes of exercise a week.  Encouraged vitamin D 1000 units and Calcium 1300mg  or 4 servings of dairy a day.  Fasting labs ordered.  Colonoscopy ordered. Mammogram ordered. Pap done today. Not sexually active.  Flu shot given today.  covid vaccine 2 done without booster. Recommended to get booster. Shingles 1 shot done. Needs 2nd shot.  PHQ stable, WNL.  .Discussed low carb diet with 1500 calories and 80g of protein.  Exercising at least 150 minutes a week.  My Fitness Pal could be a Marland Kitchen.   BP elevated out of medication. Monitor at home due to BP not to goal in office. Follow up in 6 months.     See After Visit Summary for Counseling Recommendations

## 2021-02-15 LAB — COMPLETE METABOLIC PANEL WITH GFR
AG Ratio: 1.2 (calc) (ref 1.0–2.5)
ALT: 13 U/L (ref 6–29)
AST: 14 U/L (ref 10–35)
Albumin: 4 g/dL (ref 3.6–5.1)
Alkaline phosphatase (APISO): 87 U/L (ref 37–153)
BUN: 13 mg/dL (ref 7–25)
CO2: 34 mmol/L — ABNORMAL HIGH (ref 20–32)
Calcium: 8.9 mg/dL (ref 8.6–10.4)
Chloride: 101 mmol/L (ref 98–110)
Creat: 0.85 mg/dL (ref 0.50–1.05)
GFR, Est African American: 91 mL/min/{1.73_m2} (ref >=60)
GFR, Est Non African American: 78 mL/min/{1.73_m2} (ref >=60)
Globulin: 3.3 g/dL (calc) (ref 1.9–3.7)
Glucose, Bld: 85 mg/dL (ref 65–99)
Potassium: 3.5 mmol/L (ref 3.5–5.3)
Sodium: 141 mmol/L (ref 135–146)
Total Bilirubin: 0.6 mg/dL (ref 0.2–1.2)
Total Protein: 7.3 g/dL (ref 6.1–8.1)

## 2021-02-15 LAB — CBC
HCT: 39 % (ref 35.0–45.0)
Hemoglobin: 12.7 g/dL (ref 11.7–15.5)
MCH: 26.9 pg — ABNORMAL LOW (ref 27.0–33.0)
MCHC: 32.6 g/dL (ref 32.0–36.0)
MCV: 82.6 fL (ref 80.0–100.0)
MPV: 10.1 fL (ref 7.5–12.5)
Platelets: 356 10*3/uL (ref 140–400)
RBC: 4.72 10*6/uL (ref 3.80–5.10)
RDW: 13.2 % (ref 11.0–15.0)
WBC: 7.6 10*3/uL (ref 3.8–10.8)

## 2021-02-15 LAB — LIPID PANEL W/REFLEX DIRECT LDL
Cholesterol: 232 mg/dL — ABNORMAL HIGH (ref <200)
HDL: 75 mg/dL (ref >=50)
LDL Cholesterol (Calc): 138 mg/dL (calc) — ABNORMAL HIGH
Non-HDL Cholesterol (Calc): 157 mg/dL (calc) — ABNORMAL HIGH (ref <130)
Total CHOL/HDL Ratio: 3.1 (calc) (ref <5.0)
Triglycerides: 89 mg/dL (ref <150)

## 2021-02-15 LAB — HEPATITIS C ANTIBODY
Hepatitis C Ab: NONREACTIVE
SIGNAL TO CUT-OFF: 0.01 (ref <1.00)

## 2021-02-15 NOTE — Progress Notes (Signed)
Lee-Anne,   HDL great. LDL not to goal. Continue to work on diet and lifestyle changes.   Kidney, liver, glucose look great.   JJ, please feel out forms for work and fax today.

## 2021-02-16 ENCOUNTER — Encounter: Payer: Self-pay | Admitting: Physician Assistant

## 2021-02-16 LAB — CYTOLOGY - PAP
Comment: NEGATIVE
Diagnosis: NEGATIVE
High risk HPV: NEGATIVE

## 2021-02-16 NOTE — Progress Notes (Signed)
Normal pap. No abnormal cells. Follow up in 5 years next pap.

## 2021-02-21 ENCOUNTER — Ambulatory Visit: Payer: BC Managed Care – PPO

## 2021-03-01 ENCOUNTER — Ambulatory Visit: Payer: BC Managed Care – PPO

## 2021-03-01 ENCOUNTER — Telehealth: Payer: Self-pay | Admitting: *Deleted

## 2021-03-01 NOTE — Telephone Encounter (Signed)
PA submitted through Cover My Meds for Linzess.  Waiting for response.

## 2021-03-14 ENCOUNTER — Ambulatory Visit: Payer: BC Managed Care – PPO | Admitting: Physician Assistant

## 2021-03-20 ENCOUNTER — Ambulatory Visit: Payer: BC Managed Care – PPO | Admitting: Physician Assistant

## 2021-03-22 DIAGNOSIS — Z1152 Encounter for screening for COVID-19: Secondary | ICD-10-CM | POA: Diagnosis not present

## 2021-03-28 ENCOUNTER — Other Ambulatory Visit: Payer: Self-pay

## 2021-03-28 ENCOUNTER — Encounter: Payer: Self-pay | Admitting: Physician Assistant

## 2021-03-28 ENCOUNTER — Ambulatory Visit: Payer: BC Managed Care – PPO | Admitting: Physician Assistant

## 2021-03-28 VITALS — BP 143/68 | HR 69 | Ht 65.0 in | Wt 213.0 lb

## 2021-03-28 DIAGNOSIS — Z6835 Body mass index (BMI) 35.0-35.9, adult: Secondary | ICD-10-CM

## 2021-03-28 DIAGNOSIS — E66812 Obesity, class 2: Secondary | ICD-10-CM | POA: Insufficient documentation

## 2021-03-28 DIAGNOSIS — I10 Essential (primary) hypertension: Secondary | ICD-10-CM | POA: Diagnosis not present

## 2021-03-28 MED ORDER — LISINOPRIL-HYDROCHLOROTHIAZIDE 20-25 MG PO TABS: 1.0000 | ORAL_TABLET | Freq: Every day | ORAL | 3 refills | Status: DC

## 2021-03-28 MED ORDER — SAXENDA 18 MG/3ML ~~LOC~~ SOPN: 0.6000 mg | PEN_INJECTOR | Freq: Every day | SUBCUTANEOUS | 1 refills | Status: DC

## 2021-03-28 NOTE — Progress Notes (Signed)
   Subjective:    Patient ID: Analy Reed, female    DOB: 09/04/1966, 55 y.o.   MRN: [id number]  HPI  Pt is a 55 yo obese female with HTN, HLD who presents to the clinic to discuss weight loss.   She has tried saxenda before and tolerated well. She would like to try again.   She reports her BP meds were not sent in. She would like them sent in.    .. Active Ambulatory Problems    Diagnosis Date Noted  . Obesity (BMI 30-39.9) 10/15/2011  . Migraine with aura 10/21/2012  . Essential hypertension, benign 03/03/2013  . Slow transit constipation 04/08/2014  . Abnormal weight gain 04/08/2014  . Dental caries 06/14/2014  . Class 2 obesity due to excess calories without serious comorbidity with body mass index (BMI) of 35.0 to 35.9 in adult 08/14/2015  . Stress at work 08/14/2015  . Pilar and trichilemmal cysts 01/08/2016  . Chest tightness 01/08/2016  . Migraine without aura and with status migrainosus, not intractable 01/08/2016  . Atypical chest pain 01/08/2016  . Bilateral carpal tunnel syndrome 03/28/2016  . Morning joint stiffness of hand 12/17/2017  . Bilateral hand pain 12/17/2017  . Hyperlipidemia 08/04/2018  . Right ear impacted cerumen 01/19/2020  . Pilar cyst 01/21/2020  . Hypokalemia 01/21/2020   Resolved Ambulatory Problems    Diagnosis Date Noted  . SUBCONJUNCTIVAL HEMORRHAGE 07/23/2011  . BMI 39.0-39.9,adult 04/08/2014   Past Medical History:  Diagnosis Date  . Hypertension        Review of Systems  All other systems reviewed and are negative.      Objective:   Physical Exam Vitals reviewed.  Constitutional:      Appearance: Normal appearance. She is obese.  HENT:     Head: Normocephalic.  Cardiovascular:     Rate and Rhythm: Normal rate and regular rhythm.     Pulses: Normal pulses.     Heart sounds: Normal heart sounds.  Neurological:     General: No focal deficit present.     Mental Status: She is alert and oriented to person, place, and  time.  Psychiatric:        Mood and Affect: Mood normal.           Assessment & Plan:  Marland KitchenMarland KitchenLorrine was seen today for obesity.  Diagnoses and all orders for this visit:  Class 2 severe obesity due to excess calories with serious comorbidity and body mass index (BMI) of 35.0 to 35.9 in adult (HCC) -     Liraglutide -Weight Management (SAXENDA) 18 MG/3ML SOPN; Inject 0.6 mg into the skin daily. For one week then increase by .6mg  weekly until reaches 3mg  daily.  Please include ultra fine needles 67mm  Essential hypertension, benign -     lisinopril-hydrochlorothiazide (ZESTORETIC) 20-25 MG tablet; Take 1 tablet by mouth daily.   .Discussed low carb diet with 1500 calories and 80g of protein.  Exercising at least 150 minutes a week.  My Fitness Pal could be a 11m.  Start saxenda. Will switch to wegovy once to 2.4mg  dosing.  Follow up in 3 months. Discussed saxenda is a daily injection and must increase weekly.   Resent BP medications. BP elevated today.

## 2021-04-12 ENCOUNTER — Telehealth: Payer: Self-pay | Admitting: Neurology

## 2021-04-12 NOTE — Telephone Encounter (Signed)
PA denied, appeal submitted.  Contact plan to follow up on [id number]

## 2021-04-12 NOTE — Telephone Encounter (Signed)
Prior Authorization for Saxenda submitted via covermymeds. Awaiting response.  Your information has been submitted to [employer] [place]. [employer] [place] will review the request and notify you of the determination decision directly, typically within 72 hours of receiving all information.  You will also receive your request decision electronically. To check for an update later, open this request again from your dashboard.  If [employer] [place] has not responded within the specified timeframe or if you have any questions about your PA submission, contact [employer] [place] directly at [phone].  

## 2021-04-19 NOTE — Telephone Encounter (Signed)
Received denial. LMOM letting patient know, to call her insurance if she wants to see what is covered, and to call with any questions.

## 2021-04-25 ENCOUNTER — Ambulatory Visit: Payer: BC Managed Care – PPO | Admitting: Physician Assistant

## 2021-04-30 ENCOUNTER — Ambulatory Visit: Payer: BC Managed Care – PPO | Admitting: Physician Assistant

## 2021-05-01 ENCOUNTER — Encounter: Payer: Self-pay | Admitting: Physician Assistant

## 2021-05-01 ENCOUNTER — Other Ambulatory Visit: Payer: Self-pay

## 2021-05-01 ENCOUNTER — Ambulatory Visit: Payer: BC Managed Care – PPO | Admitting: Physician Assistant

## 2021-05-01 VITALS — BP 142/74 | HR 69 | Ht 65.0 in | Wt 210.0 lb

## 2021-05-01 DIAGNOSIS — Z6834 Body mass index (BMI) 34.0-34.9, adult: Secondary | ICD-10-CM

## 2021-05-01 DIAGNOSIS — I10 Essential (primary) hypertension: Secondary | ICD-10-CM | POA: Diagnosis not present

## 2021-05-01 DIAGNOSIS — E6609 Other obesity due to excess calories: Secondary | ICD-10-CM | POA: Diagnosis not present

## 2021-05-01 MED ORDER — QSYMIA 7.5-46 MG PO CP24: 1.0000 | ORAL_CAPSULE | Freq: Every day | ORAL | 0 refills | Status: DC

## 2021-05-01 NOTE — Progress Notes (Signed)
Subjective:    Patient ID: Tammy Reed, female    DOB: [birth year], 55 y.o.   MRN: [id number]  HPI Patient is a 55 year old obese female with hypertension who presents to the clinic to discuss weight.  At last visit patient was prescribed Saxenda which insurance did not pay for.  They will also not pay for [facility].  Patient really wants to target weight loss.  She feels like she needs help.  She has responded well to phentermine in the past.  She does tend to just gain the weight right back when she stops.  She is trying to stay more active with walking every other day and watching her cravings. In the past she has tried Topamax and Wellbutrin along with little benefit.  .. Active Ambulatory Problems    Diagnosis Date Noted   Obesity (BMI 30-39.9) 10/15/2011   Migraine with aura 10/21/2012   Essential hypertension, benign 03/03/2013   Slow transit constipation 04/08/2014   Abnormal weight gain 04/08/2014   Dental caries 06/14/2014   Class 1 obesity due to excess calories without serious comorbidity with body mass index (BMI) of 34.0 to 34.9 in adult 08/14/2015   Stress at work 08/14/2015   Pilar and trichilemmal cysts 01/08/2016   Chest tightness 01/08/2016   Migraine without aura and with status migrainosus, not intractable 01/08/2016   Atypical chest pain 01/08/2016   Bilateral carpal tunnel syndrome 03/28/2016   Morning joint stiffness of hand 12/17/2017   Bilateral hand pain 12/17/2017   Hyperlipidemia 08/04/2018   Right ear impacted cerumen 01/19/2020   Pilar cyst 01/21/2020   Hypokalemia 01/21/2020   Class 2 severe obesity due to excess calories with serious comorbidity and body mass index (BMI) of 35.0 to 35.9 in adult [facility]) 03/28/2021   Resolved Ambulatory Problems    Diagnosis Date Noted   SUBCONJUNCTIVAL HEMORRHAGE 07/23/2011   BMI 39.0-39.9,adult 04/08/2014   Past Medical History:  Diagnosis Date   Hypertension       Review of Systems  All other systems  reviewed and are negative.     Objective:   Physical Exam Vitals reviewed.  Constitutional:      Appearance: Normal appearance. She is obese.  HENT:     Head: Normocephalic.  Cardiovascular:     Rate and Rhythm: Normal rate and regular rhythm.  Pulmonary:     Effort: Pulmonary effort is normal.     Breath sounds: Normal breath sounds.  Neurological:     General: No focal deficit present.     Mental Status: She is alert and oriented to person, place, and time.  Psychiatric:        Mood and Affect: Mood normal.        Behavior: Behavior normal.          Assessment & Plan:  Marland KitchenMarland KitchenDyneisha was seen today for obesity.  Diagnoses and all orders for this visit:  Class 1 obesity due to excess calories without serious comorbidity with body mass index (BMI) of 34.0 to 34.9 in adult -     Phentermine-Topiramate (QSYMIA) 7.5-46 MG CP24; Take 1 tablet by mouth daily.  Qsymia showing preferred.  Would not pay for saxenda or wegovy.  Failed topamax alone.  Responded well to phentermine.  Wellbutrin no significant improvement alone.  BP not to goal but did not take BP medication this morning. MUST take BP medication.  Start Qsymia. Discussed side effects.  Marland Kitchen.Discussed low carb diet with 1500 calories and 80g of protein.  Exercising at  least 150 minutes a week.  My Fitness Pal could be a Chief Technology Officer.  Follow up in 1 month for BP recheck and weight.

## 2021-05-01 NOTE — Patient Instructions (Addendum)
Contact [facility] for colonoscopy [phone]

## 2021-05-02 ENCOUNTER — Telehealth: Payer: Self-pay | Admitting: Neurology

## 2021-05-02 MED ORDER — TOPIRAMATE 50 MG PO TABS: ORAL_TABLET | ORAL | 0 refills | Status: DC

## 2021-05-02 MED ORDER — PHENTERMINE HCL 15 MG PO CAPS: 15.0000 mg | ORAL_CAPSULE | ORAL | 0 refills | Status: DC

## 2021-05-02 NOTE — Telephone Encounter (Signed)
Patient contacted her insurance company and they are not covering any type of weight loss medications. She is asking if there is some sort of inexpensive appetite suppressant that you could prescribe for her. Please advise. [phone].

## 2021-05-02 NOTE — Telephone Encounter (Signed)
Tried to call patient, no answer, no vm.

## 2021-05-02 NOTE — Telephone Encounter (Signed)
LMOM letting patient know and to call back with any problems/questions.

## 2021-06-27 DIAGNOSIS — Z6834 Body mass index (BMI) 34.0-34.9, adult: Secondary | ICD-10-CM | POA: Diagnosis not present

## 2021-06-27 DIAGNOSIS — U071 COVID-19: Secondary | ICD-10-CM | POA: Diagnosis not present

## 2021-10-22 ENCOUNTER — Ambulatory Visit (INDEPENDENT_AMBULATORY_CARE_PROVIDER_SITE_OTHER): Payer: BC Managed Care – PPO | Admitting: Physician Assistant

## 2021-10-22 ENCOUNTER — Encounter: Payer: Self-pay | Admitting: Physician Assistant

## 2021-10-22 ENCOUNTER — Other Ambulatory Visit: Payer: Self-pay

## 2021-10-22 VITALS — BP 118/55 | HR 78 | Temp 98.0°F | Ht 65.0 in | Wt 219.0 lb

## 2021-10-22 DIAGNOSIS — R9431 Abnormal electrocardiogram [ECG] [EKG]: Secondary | ICD-10-CM | POA: Diagnosis not present

## 2021-10-22 DIAGNOSIS — Z1211 Encounter for screening for malignant neoplasm of colon: Secondary | ICD-10-CM

## 2021-10-22 DIAGNOSIS — E6609 Other obesity due to excess calories: Secondary | ICD-10-CM | POA: Diagnosis not present

## 2021-10-22 DIAGNOSIS — R0789 Other chest pain: Secondary | ICD-10-CM

## 2021-10-22 DIAGNOSIS — Z6836 Body mass index (BMI) 36.0-36.9, adult: Secondary | ICD-10-CM

## 2021-10-22 DIAGNOSIS — I10 Essential (primary) hypertension: Secondary | ICD-10-CM | POA: Diagnosis not present

## 2021-10-22 MED ORDER — LISINOPRIL-HYDROCHLOROTHIAZIDE 20-25 MG PO TABS
1.0000 | ORAL_TABLET | Freq: Every day | ORAL | 1 refills | Status: DC
Start: 2021-10-22 — End: 2022-03-13

## 2021-10-22 MED ORDER — TOPIRAMATE 50 MG PO TABS
ORAL_TABLET | ORAL | 3 refills | Status: DC
Start: 2021-10-22 — End: 2022-12-11

## 2021-10-22 NOTE — Progress Notes (Signed)
Subjective:    Patient ID: Tammy Reed, female    DOB: 07/14/1966, 55 y.o.   MRN: [id number]  HPI Pt is a 55 yo obese female with HTN, migraines who presents to the clinic for medication refills.   She denies any palpitations but she is having intermittent chest pains at rest and with exertion on the left side. No SOB or cough. No fever, chills. She is taking her lisinopril/HCTZ most days. CP not related to stress/anxiety/eating.   She wants to lose weight. Insurance would not pay for saxenda. Phentermine 15mg  per patient does not work. She wants phentermine 37.5mg .   .. Active Ambulatory Problems    Diagnosis Date Noted   Obesity (BMI 30-39.9) 10/15/2011   Migraine with aura 10/21/2012   Essential hypertension, benign 03/03/2013   Slow transit constipation 04/08/2014   Abnormal weight gain 04/08/2014   Dental caries 06/14/2014   Class 2 obesity due to excess calories without serious comorbidity with body mass index (BMI) of 36.0 to 36.9 in adult 08/14/2015   Stress at work 08/14/2015   Pilar and trichilemmal cysts 01/08/2016   Chest tightness 01/08/2016   Migraine without aura and with status migrainosus, not intractable 01/08/2016   Atypical chest pain 01/08/2016   Bilateral carpal tunnel syndrome 03/28/2016   Morning joint stiffness of hand 12/17/2017   Bilateral hand pain 12/17/2017   Hyperlipidemia 08/04/2018   Right ear impacted cerumen 01/19/2020   Pilar cyst 01/21/2020   Hypokalemia 01/21/2020   Class 2 severe obesity due to excess calories with serious comorbidity and body mass index (BMI) of 35.0 to 35.9 in adult [facility]) 03/28/2021   Long QT interval 10/22/2021   Resolved Ambulatory Problems    Diagnosis Date Noted   SUBCONJUNCTIVAL HEMORRHAGE 07/23/2011   BMI 39.0-39.9,adult 04/08/2014   Past Medical History:  Diagnosis Date   Hypertension       Review of Systems    See HPI.  Objective:   Physical Exam Vitals reviewed.  Constitutional:       Appearance: Normal appearance.  HENT:     Head: Normocephalic.  Neck:     Vascular: No carotid bruit.  Cardiovascular:     Rate and Rhythm: Normal rate and regular rhythm.     Pulses: Normal pulses.     Heart sounds: Normal heart sounds.  Pulmonary:     Effort: Pulmonary effort is normal.     Breath sounds: Normal breath sounds.  Musculoskeletal:     Right lower leg: No edema.     Left lower leg: No edema.  Neurological:     Mental Status: She is alert.  Psychiatric:        Mood and Affect: Mood normal.          Assessment & Plan:  05/24/2015Marland KitchenCameshia was seen today for hypertension.  Diagnoses and all orders for this visit:  Essential hypertension, benign -     lisinopril-hydrochlorothiazide (ZESTORETIC) 20-25 MG tablet; Take 1 tablet by mouth daily. -     EKG 12-Lead  Colon cancer screening -     Cologuard  Long QT interval -     EKG 12-Lead  Class 2 obesity due to excess calories without serious comorbidity with body mass index (BMI) of 36.0 to 36.9 in adult -     topiramate (TOPAMAX) 50 MG tablet; Take 1 tablet at bedtime.  Atypical chest pain  Agreed to return cologuard.  BP looks good today. Lisinopril/HCTZ refilled.  Cmp ordered.  EKG for chest pain  ordered:  NSR Prolonged QT changed from last EKG. No ST elevation.  She does have a R to S wave variant but unchanged from back in 2014 EKG.  No acute changes.  Will order stress test.  Hold on phentermine.  Ok to continue topamax for now.  Follow up in 6 weeks.

## 2021-10-22 NOTE — Patient Instructions (Signed)
Will get stress test.  Drink more water.

## 2021-10-26 NOTE — Addendum Note (Signed)
Addended by: Donne Anon L on: 10/26/2021 11:05 AM   Modules accepted: Orders

## 2021-10-30 ENCOUNTER — Ambulatory Visit (HOSPITAL_COMMUNITY)
Admission: RE | Admit: 2021-10-30 | Payer: BC Managed Care – PPO | Source: Ambulatory Visit | Attending: Physician Assistant | Admitting: Physician Assistant

## 2021-10-31 ENCOUNTER — Telehealth (HOSPITAL_COMMUNITY): Payer: Self-pay | Admitting: *Deleted

## 2021-10-31 NOTE — Telephone Encounter (Signed)
Close encounter 

## 2021-11-01 ENCOUNTER — Telehealth (HOSPITAL_COMMUNITY): Payer: Self-pay | Admitting: *Deleted

## 2021-11-01 NOTE — Telephone Encounter (Signed)
Close encounter 

## 2021-11-02 ENCOUNTER — Ambulatory Visit (HOSPITAL_COMMUNITY)
Admission: RE | Admit: 2021-11-02 | Payer: BC Managed Care – PPO | Source: Ambulatory Visit | Attending: Physician Assistant | Admitting: Physician Assistant

## 2021-11-06 ENCOUNTER — Telehealth (HOSPITAL_COMMUNITY): Payer: Self-pay | Admitting: *Deleted

## 2021-11-06 NOTE — Telephone Encounter (Signed)
Close encounter 

## 2021-11-07 ENCOUNTER — Ambulatory Visit (HOSPITAL_COMMUNITY)
Admission: RE | Admit: 2021-11-07 | Payer: BC Managed Care – PPO | Source: Ambulatory Visit | Attending: Physician Assistant | Admitting: Physician Assistant

## 2021-11-20 ENCOUNTER — Encounter (HOSPITAL_COMMUNITY): Payer: Self-pay | Admitting: Physician Assistant

## 2021-12-03 ENCOUNTER — Ambulatory Visit: Payer: BC Managed Care – PPO | Admitting: Physician Assistant

## 2022-01-03 ENCOUNTER — Emergency Department (HOSPITAL_COMMUNITY)
Admission: EM | Admit: 2022-01-03 | Discharge: 2022-01-03 | Disposition: A | Discharge status: Home / Self Care | Payer: BC Managed Care – PPO | Attending: Emergency Medicine | Admitting: Emergency Medicine

## 2022-01-03 ENCOUNTER — Encounter (HOSPITAL_COMMUNITY): Payer: Self-pay | Admitting: Emergency Medicine

## 2022-01-03 DIAGNOSIS — M25512 Pain in left shoulder: Secondary | ICD-10-CM | POA: Insufficient documentation

## 2022-01-03 DIAGNOSIS — M25511 Pain in right shoulder: Secondary | ICD-10-CM | POA: Diagnosis not present

## 2022-01-03 DIAGNOSIS — Y92410 Unspecified street and highway as the place of occurrence of the external cause: Secondary | ICD-10-CM | POA: Insufficient documentation

## 2022-01-03 DIAGNOSIS — M545 Low back pain, unspecified: Secondary | ICD-10-CM | POA: Diagnosis not present

## 2022-01-03 DIAGNOSIS — S39012A Strain of muscle, fascia and tendon of lower back, initial encounter: Secondary | ICD-10-CM | POA: Insufficient documentation

## 2022-01-03 MED ORDER — NAPROXEN 375 MG PO TABS: 375.0000 mg | ORAL_TABLET | Freq: Two times a day (BID) | ORAL | 0 refills | Status: AC

## 2022-01-03 MED ORDER — METHOCARBAMOL 500 MG PO TABS
500.0000 mg | ORAL_TABLET | Freq: Two times a day (BID) | ORAL | 0 refills | Status: AC
Start: 2022-01-03 — End: 2022-01-10
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
## 2022-01-03 NOTE — ED Notes (Signed)
Staff called pt, pt didn't answer.
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
## 2022-01-03 NOTE — Discharge Instructions (Addendum)
I have prescribed a short course of muscle relaxers to help with your pain.  Please take 1 tablet twice a day for the next 7 days.  Please be aware this medication can make you drowsy.  In addition, I prescribed a short course of anti-inflammatories to help with your pain.  Please take 1 tablet twice a day with food.  In addition, you may benefit from some heat, ice to help with your symptoms.

## 2022-03-13 ENCOUNTER — Telehealth: Payer: Self-pay

## 2022-03-13 ENCOUNTER — Ambulatory Visit: Payer: BC Managed Care – PPO | Admitting: Physician Assistant

## 2022-03-13 ENCOUNTER — Encounter: Payer: Self-pay | Admitting: Physician Assistant

## 2022-03-13 VITALS — BP 151/74 | HR 56 | Resp 16 | Ht 65.0 in | Wt 204.0 lb

## 2022-03-13 DIAGNOSIS — I10 Essential (primary) hypertension: Secondary | ICD-10-CM | POA: Diagnosis not present

## 2022-03-13 DIAGNOSIS — Z1211 Encounter for screening for malignant neoplasm of colon: Secondary | ICD-10-CM

## 2022-03-13 DIAGNOSIS — Z1329 Encounter for screening for other suspected endocrine disorder: Secondary | ICD-10-CM | POA: Diagnosis not present

## 2022-03-13 DIAGNOSIS — Z1231 Encounter for screening mammogram for malignant neoplasm of breast: Secondary | ICD-10-CM

## 2022-03-13 DIAGNOSIS — Z13220 Encounter for screening for lipoid disorders: Secondary | ICD-10-CM

## 2022-03-13 DIAGNOSIS — Z6833 Body mass index (BMI) 33.0-33.9, adult: Secondary | ICD-10-CM

## 2022-03-13 DIAGNOSIS — K5901 Slow transit constipation: Secondary | ICD-10-CM

## 2022-03-13 DIAGNOSIS — E782 Mixed hyperlipidemia: Secondary | ICD-10-CM

## 2022-03-13 DIAGNOSIS — E6609 Other obesity due to excess calories: Secondary | ICD-10-CM

## 2022-03-13 DIAGNOSIS — Z79899 Other long term (current) drug therapy: Secondary | ICD-10-CM

## 2022-03-13 DIAGNOSIS — H6123 Impacted cerumen, bilateral: Secondary | ICD-10-CM

## 2022-03-13 MED ORDER — PHENTERMINE HCL 37.5 MG PO TABS: ORAL_TABLET | ORAL | 0 refills | Status: DC

## 2022-03-13 MED ORDER — HYDROCHLOROTHIAZIDE 25 MG PO TABS: 25.0000 mg | ORAL_TABLET | Freq: Every day | ORAL | 3 refills | Status: DC

## 2022-03-13 MED ORDER — LINACLOTIDE 145 MCG PO CAPS: 145.0000 ug | ORAL_CAPSULE | Freq: Every day | ORAL | 3 refills | Status: DC

## 2022-03-13 NOTE — Progress Notes (Signed)
? ?Acute Office Visit ? ?Subjective:  ? ?  ?Patient ID: Tammy Reed, female    DOB: May 12, 1966, 56 y.o.   MRN: [id number] ? ?Chief Complaint  ?Patient presents with  ? Discuss Weight  ?  Patient would like to discuss refilling Phentermine. Patient states she was seeing the bariatric clinic and was prescribed the 37.5 mg dose of Phentermine in March.   ? ? ?HPI ?Patient is in today to discuss weight loss.  ? ?Pt went to weight loss clinic and started on phentermine for last month but does not want to continue to go and wants me to prescribe it. Per patient she has lost 10lbs. She denies any side effects. I once gave her 15mg  and did not seem to help. She is walking daily. She is struggling with diet. She is not taking her BP medication today but her BP at home per patient are 120s over 70s. No CP, palpitations, headaches or vision changes.  ? ?.. ?Active Ambulatory Problems  ?  Diagnosis Date Noted  ? Obesity (BMI 30-39.9) 10/15/2011  ? Migraine with aura 10/21/2012  ? Essential hypertension, benign 03/03/2013  ? Slow transit constipation 04/08/2014  ? Abnormal weight gain 04/08/2014  ? Dental caries 06/14/2014  ? Class 1 obesity due to excess calories without serious comorbidity with body mass index (BMI) of 33.0 to 33.9 in adult 08/14/2015  ? Stress at work 08/14/2015  ? Pilar and trichilemmal cysts 01/08/2016  ? Chest tightness 01/08/2016  ? Migraine without aura and with status migrainosus, not intractable 01/08/2016  ? Atypical chest pain 01/08/2016  ? Bilateral carpal tunnel syndrome 03/28/2016  ? Morning joint stiffness of hand 12/17/2017  ? Bilateral hand pain 12/17/2017  ? Hyperlipidemia 08/04/2018  ? Bilateral impacted cerumen 01/19/2020  ? Pilar cyst 01/21/2020  ? Hypokalemia 01/21/2020  ? Class 2 severe obesity due to excess calories with serious comorbidity and body mass index (BMI) of 35.0 to 35.9 in adult [facility]) 03/28/2021  ? Long QT interval 10/22/2021  ? ?Resolved Ambulatory Problems  ?  Diagnosis Date  Noted  ? SUBCONJUNCTIVAL HEMORRHAGE 07/23/2011  ? BMI 39.0-39.9,adult 04/08/2014  ? ?Past Medical History:  ?Diagnosis Date  ? Hypertension   ? ? ? ?Review of Systems  ?All other systems reviewed and are negative. ? ? ?   ?Objective:  ?  ?BP (!) 151/74   Pulse (!) 56   Resp 16   Ht 5\' 5"  (1.651 m)   Wt 204 lb (92.5 kg)   SpO2 98%   BMI 33.95 kg/m?  ?BP Readings from Last 3 Encounters:  ?03/13/22 (!) 151/74  ?01/03/22 139/83  ?10/22/21 (!) 118/55  ? ?Wt Readings from Last 3 Encounters:  ?03/13/22 204 lb (92.5 kg)  ?10/22/21 219 lb (99.3 kg)  ?05/01/21 210 lb (95.3 kg)  ? ?  ? ?Physical Exam ?Vitals reviewed.  ?Constitutional:   ?   Appearance: Normal appearance. She is obese.  ?HENT:  ?   Head: Normocephalic.  ?   Right Ear: There is impacted cerumen.  ?   Left Ear: There is impacted cerumen.  ?   Nose: Nose normal.  ?   Mouth/Throat:  ?   Mouth: Mucous membranes are moist.  ?Cardiovascular:  ?   Rate and Rhythm: Normal rate and regular rhythm.  ?Pulmonary:  ?   Effort: Pulmonary effort is normal.  ?   Breath sounds: Normal breath sounds.  ?Neurological:  ?   General: No focal deficit present.  ?  Mental Status: She is alert.  ?Psychiatric:     ?   Mood and Affect: Mood normal.  ? ? ? ? ?Marland Kitchen.Cerumen Removal Template: ?Indication: Cerumen impaction of the ear(s) ?Medical necessity statement: On physical examination, cerumen impairs clinically significant portions of the external auditory canal, and tympanic membrane. Noted obstructive, copious cerumen that cannot be removed without magnification and instrumentations requiring physician skills ?Consent: Discussed benefits and risks of procedure and verbal consent obtained ?Procedure: Patient was prepped for the procedure. Utilized an otoscope to assess and take note of the ear canal, the tympanic membrane, and the presence, amount, and placement of the cerumen. Gentle water irrigation and soft plastic curette was utilized to remove cerumen.  ?Post procedure  examination shows cerumen was completely removed. Patient tolerated procedure well. The patient is made aware that they may experience temporary vertigo, temporary hearing loss, and temporary discomfort. If these symptom last for more than 24 hours to call the clinic or proceed to the ED. ? ?   ?Assessment & Plan:  ?..Havana was seen today for discuss weight. ? ?Diagnoses and all orders for this visit: ? ?Essential hypertension, benign ?-     COMPLETE METABOLIC PANEL WITH GFR ?-     hydrochlorothiazide (HYDRODIURIL) 25 MG tablet; Take 1 tablet (25 mg total) by mouth daily. ? ?Screening for lipid disorders ?-     Lipid Panel w/reflex Direct LDL ? ?Thyroid disorder screen ?-     TSH ? ?Mixed hyperlipidemia ?-     Lipid Panel w/reflex Direct LDL ? ?Medication management ?-     TSH ?-     Lipid Panel w/reflex Direct LDL ?-     COMPLETE METABOLIC PANEL WITH GFR ?-     CBC with Differential/Platelet ? ?Slow transit constipation ?-     linaclotide (LINZESS) 145 MCG CAPS capsule; Take 1 capsule (145 mcg total) by mouth daily before breakfast. ? ?Colon cancer screening ?-     Cologuard ? ?Class 1 obesity due to excess calories without serious comorbidity with body mass index (BMI) of 33.0 to 33.9 in adult ?-     phentermine (ADIPEX-P) 37.5 MG tablet; One tab by mouth qAM ? ?Visit for screening mammogram ?-     MM 3D SCREEN BREAST BILATERAL ? ?Bilateral impacted cerumen ? ? ?Marland Kitchen.Discussed low carb diet with 1500 calories and 80g of protein.  ?Exercising at least 150 minutes a week.  ?[employer] Fitness Pal could be a Chief Technology Officer.  ?Insurance will not pay for other weight loss medications ?Agreed to start for 3 months but not ongoing for more than 6 months  ?Start HCtZ for HTN, getting better home readings ?Work on lower salt in diet.  ?Cerumen impaction removed today ?Needs to get fasting labs.  ? ? ?Return in about 3 months (around 06/12/2022) for Follow up. ? ?Tandy Gaw, PA-C ? ? ?

## 2022-03-13 NOTE — Telephone Encounter (Addendum)
Initiated Prior authorization [id number] ?Via: Covermymeds ?Case/Key:[id number] ?Status: Approved  as of 03/13/22 ?Reason:Effective from 03/13/2022 through 03/12/2023. ? ?Notified Pt via: pt does not have Mychart, called pt  left voicemail ? ?

## 2022-05-09 ENCOUNTER — Ambulatory Visit: Payer: BC Managed Care – PPO

## 2022-06-24 ENCOUNTER — Ambulatory Visit: Payer: BC Managed Care – PPO | Admitting: Physician Assistant

## 2022-08-12 ENCOUNTER — Telehealth: Payer: Self-pay | Admitting: Neurology

## 2022-08-12 NOTE — Telephone Encounter (Signed)
Patient LVM asking for RX for Linzess to be sent to the pharmacy. CB [phone]. Called back, LVM letting patient know a year of RX was sent in April to [facility] in [place]. TO call us with any other questions.

## 2022-09-02 ENCOUNTER — Ambulatory Visit: Payer: BC Managed Care – PPO | Admitting: Physician Assistant

## 2022-09-06 ENCOUNTER — Ambulatory Visit: Payer: BC Managed Care – PPO | Admitting: Physician Assistant

## 2022-09-11 ENCOUNTER — Ambulatory Visit: Payer: BC Managed Care – PPO | Admitting: Physician Assistant

## 2022-11-20 ENCOUNTER — Ambulatory Visit: Payer: BC Managed Care – PPO | Admitting: Physician Assistant

## 2022-11-26 ENCOUNTER — Ambulatory Visit: Payer: BC Managed Care – PPO | Admitting: Physician Assistant

## 2022-11-26 ENCOUNTER — Telehealth: Payer: Self-pay | Admitting: Physician Assistant

## 2022-11-26 DIAGNOSIS — E782 Mixed hyperlipidemia: Secondary | ICD-10-CM

## 2022-11-26 DIAGNOSIS — I10 Essential (primary) hypertension: Secondary | ICD-10-CM

## 2022-11-26 DIAGNOSIS — Z79899 Other long term (current) drug therapy: Secondary | ICD-10-CM

## 2022-11-26 DIAGNOSIS — Z1329 Encounter for screening for other suspected endocrine disorder: Secondary | ICD-10-CM

## 2022-11-26 NOTE — Telephone Encounter (Signed)
Will cancel since she called. Thanks!

## 2022-11-26 NOTE — Telephone Encounter (Signed)
Pt called in at 8:00 am to reschedule her 8:50 appt today due to being sick with the flu. Do you want to cancel or no show the appt?

## 2022-12-04 ENCOUNTER — Ambulatory Visit: Payer: BC Managed Care – PPO | Admitting: Physician Assistant

## 2022-12-11 ENCOUNTER — Ambulatory Visit: Payer: BC Managed Care – PPO | Admitting: Physician Assistant

## 2022-12-11 ENCOUNTER — Encounter: Payer: Self-pay | Admitting: Physician Assistant

## 2022-12-11 VITALS — BP 160/73 | HR 60 | Ht 65.0 in | Wt 198.0 lb

## 2022-12-11 DIAGNOSIS — Z6832 Body mass index (BMI) 32.0-32.9, adult: Secondary | ICD-10-CM

## 2022-12-11 DIAGNOSIS — I10 Essential (primary) hypertension: Secondary | ICD-10-CM | POA: Diagnosis not present

## 2022-12-11 DIAGNOSIS — Z1211 Encounter for screening for malignant neoplasm of colon: Secondary | ICD-10-CM

## 2022-12-11 DIAGNOSIS — Z23 Encounter for immunization: Secondary | ICD-10-CM

## 2022-12-11 DIAGNOSIS — Z79899 Other long term (current) drug therapy: Secondary | ICD-10-CM | POA: Diagnosis not present

## 2022-12-11 DIAGNOSIS — E6609 Other obesity due to excess calories: Secondary | ICD-10-CM

## 2022-12-11 DIAGNOSIS — Z0184 Encounter for antibody response examination: Secondary | ICD-10-CM | POA: Diagnosis not present

## 2022-12-11 DIAGNOSIS — Z13220 Encounter for screening for lipoid disorders: Secondary | ICD-10-CM | POA: Diagnosis not present

## 2022-12-11 DIAGNOSIS — E782 Mixed hyperlipidemia: Secondary | ICD-10-CM | POA: Diagnosis not present

## 2022-12-11 MED ORDER — TOPIRAMATE 50 MG PO TABS: ORAL_TABLET | ORAL | 3 refills | Status: DC

## 2022-12-11 MED ORDER — ZEPBOUND 2.5 MG/0.5ML ~~LOC~~ SOAJ: 2.5000 mg | SUBCUTANEOUS | 0 refills | Status: DC

## 2022-12-11 MED ORDER — HYDROCHLOROTHIAZIDE 25 MG PO TABS: 25.0000 mg | ORAL_TABLET | Freq: Every day | ORAL | 3 refills | Status: DC

## 2022-12-11 NOTE — Progress Notes (Signed)
Established Patient Office Visit  Subjective   Patient ID: Tammy Reed, female    DOB: Feb 07, 1966  Age: 57 y.o. MRN: [id number]  Chief Complaint  Patient presents with   Weight Loss    HPI Pt is a 57 yo obese female with HTN who presents to the clinic to discuss weight and follow up.   She has not had HCTZ in a few weeks. No CP, palpitations, headaches, or vision changes.   She has also been out of phentermine and topamax since October. She has lost 8lbs since October. She is active at work but without topamax did start drinking sodas again. She would like to try any weight loss medications that she can.   She needs TB screen for work.   .. 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   Chest tightness 01/08/2016   Migraine without aura and with status migrainosus, not intractable 01/08/2016   Atypical chest pain 01/08/2016   Bilateral carpal tunnel syndrome 03/28/2016   Morning joint stiffness of hand 12/17/2017   Bilateral hand pain 12/17/2017   Hyperlipidemia 08/04/2018   Bilateral impacted cerumen 01/19/2020   Pilar cyst 01/21/2020   Hypokalemia 01/21/2020   Class 2 severe obesity due to excess calories with serious comorbidity and body mass index (BMI) of 35.0 to 35.9 in adult [facility]) 03/28/2021   Long QT interval 10/22/2021   Resolved Ambulatory Problems    Diagnosis Date Noted   SUBCONJUNCTIVAL HEMORRHAGE 07/23/2011   BMI 39.0-39.9,adult 04/08/2014   Past Medical History:  Diagnosis Date   Hypertension      Review of Systems  All other systems  reviewed and are negative.     Objective:     BP (!) 160/73 (BP Location: Right Arm, Patient Position: Sitting, Cuff Size: Small)   Pulse 60   Ht 5\' 5"  (1.651 m)   Wt 198 lb 0.6 oz (89.8 kg)   SpO2 99%   BMI 32.96 kg/m  BP Readings from Last 3 Encounters:  12/11/22 (!) 160/73  03/13/22 (!) 151/74  01/03/22 139/83   Wt Readings from Last 3 Encounters:  12/11/22 198 lb 0.6 oz (89.8 kg)  03/13/22 204 lb (92.5 kg)  10/22/21 219 lb (99.3 kg)      Physical Exam Vitals reviewed.  Constitutional:      Appearance: Normal appearance.  HENT:     Head: Normocephalic.  Cardiovascular:     Rate and Rhythm: Normal rate and regular rhythm.  Pulmonary:     Effort: Pulmonary effort is normal.     Breath sounds: Normal breath sounds.  Neurological:     General: No focal deficit present.     Mental Status: She is alert and oriented to person, place, and time.  Psychiatric:        Mood and Affect: Mood normal.     The 10-year ASCVD risk score (Arnett DK, et al., 2019) is: 8.4%    Assessment & Plan:  Marland KitchenMarland KitchenZailey was seen today for weight loss.  Diagnoses and all orders for this visit:  Essential hypertension, benign -  hydrochlorothiazide (HYDRODIURIL) 25 MG tablet; Take 1 tablet (25 mg total) by mouth daily.  Colon cancer screening -     Cologuard  Class 1 obesity due to excess calories without serious comorbidity with body mass index (BMI) of 32.0 to 32.9 in adult -     tirzepatide (ZEPBOUND) 2.5 MG/0.5ML Pen; Inject 2.5 mg into the skin once a week. -     topiramate (TOPAMAX) 50 MG tablet; Take 1 tablet at bedtime.  Immunity status testing -     PPD  Need for Tdap vaccination -     Tdap vaccine greater than or equal to 7yo IM  Need for immunization against influenza -     Flu Vaccine QUAD 54mo+IM (Fluarix, Fluzone & Alfiuria Quad PF)   BP not to goal  Restart HCTZ and recheck in 2 weeks No phentermine given today Restart topamax for weight Discussed zepbound  weekly sent 2.5mg  Discussed side effects Follow up for CPE in 2-3 weeks TB screening done today with read in 2 days.  Flu and Tdap given today without complications Needs colon cancer screening, sent cologuard.   Needs fastings labs, order form given today.   Marland Kitchen.Discussed low carb diet with 1500 calories and 80g of protein.  Exercising at least 150 minutes a week.  My Fitness Pal could be a Microbiologist.      Iran Planas, PA-C

## 2022-12-12 LAB — CBC WITH DIFFERENTIAL/PLATELET
Absolute Monocytes: 498 cells/uL (ref 200–950)
Basophils Absolute: 19 cells/uL (ref 0–200)
Basophils Relative: 0.2 %
Eosinophils Absolute: 103 cells/uL (ref 15–500)
Eosinophils Relative: 1.1 %
HCT: 38.6 % (ref 35.0–45.0)
Hemoglobin: 12.5 g/dL (ref 11.7–15.5)
Lymphs Abs: 3365 cells/uL (ref 850–3900)
MCH: 26.9 pg — ABNORMAL LOW (ref 27.0–33.0)
MCHC: 32.4 g/dL (ref 32.0–36.0)
MCV: 83.2 fL (ref 80.0–100.0)
MPV: 9.6 fL (ref 7.5–12.5)
Monocytes Relative: 5.3 %
Neutro Abs: 5414 cells/uL (ref 1500–7800)
Neutrophils Relative %: 57.6 %
Platelets: 380 10*3/uL (ref 140–400)
RBC: 4.64 10*6/uL (ref 3.80–5.10)
RDW: 13.4 % (ref 11.0–15.0)
Total Lymphocyte: 35.8 %
WBC: 9.4 10*3/uL (ref 3.8–10.8)

## 2022-12-12 LAB — COMPLETE METABOLIC PANEL WITH GFR
AG Ratio: 1.3 (calc) (ref 1.0–2.5)
ALT: 13 U/L (ref 6–29)
AST: 13 U/L (ref 10–35)
Albumin: 4 g/dL (ref 3.6–5.1)
Alkaline phosphatase (APISO): 96 U/L (ref 37–153)
BUN: 13 mg/dL (ref 7–25)
CO2: 33 mmol/L — ABNORMAL HIGH (ref 20–32)
Calcium: 9 mg/dL (ref 8.6–10.4)
Chloride: 100 mmol/L (ref 98–110)
Creat: 0.78 mg/dL (ref 0.50–1.03)
Globulin: 3.2 g/dL (calc) (ref 1.9–3.7)
Glucose, Bld: 82 mg/dL (ref 65–99)
Potassium: 3.1 mmol/L — ABNORMAL LOW (ref 3.5–5.3)
Sodium: 142 mmol/L (ref 135–146)
Total Bilirubin: 0.9 mg/dL (ref 0.2–1.2)
Total Protein: 7.2 g/dL (ref 6.1–8.1)
eGFR: 90 mL/min/{1.73_m2} (ref >=60)

## 2022-12-12 LAB — LIPID PANEL W/REFLEX DIRECT LDL
Cholesterol: 228 mg/dL — ABNORMAL HIGH (ref <200)
HDL: 67 mg/dL (ref >=50)
LDL Cholesterol (Calc): 138 mg/dL (calc) — ABNORMAL HIGH
Non-HDL Cholesterol (Calc): 161 mg/dL (calc) — ABNORMAL HIGH (ref <130)
Total CHOL/HDL Ratio: 3.4 (calc) (ref <5.0)
Triglycerides: 111 mg/dL (ref <150)

## 2022-12-12 LAB — TSH: TSH: 3.6 mIU/L

## 2022-12-13 ENCOUNTER — Other Ambulatory Visit: Payer: Self-pay | Admitting: Physician Assistant

## 2022-12-13 ENCOUNTER — Ambulatory Visit (INDEPENDENT_AMBULATORY_CARE_PROVIDER_SITE_OTHER): Payer: BC Managed Care – PPO | Admitting: Physician Assistant

## 2022-12-13 ENCOUNTER — Other Ambulatory Visit: Payer: Self-pay

## 2022-12-13 VITALS — BP 160/73 | HR 60 | Ht 65.0 in | Wt 198.0 lb

## 2022-12-13 DIAGNOSIS — Z111 Encounter for screening for respiratory tuberculosis: Secondary | ICD-10-CM | POA: Diagnosis not present

## 2022-12-13 DIAGNOSIS — E876 Hypokalemia: Secondary | ICD-10-CM

## 2022-12-13 LAB — TB SKIN TEST
Induration: 0 mm
TB Skin Test: NEGATIVE

## 2022-12-13 MED ORDER — ATORVASTATIN CALCIUM 40 MG PO TABS: 40.0000 mg | ORAL_TABLET | Freq: Every day | ORAL | 3 refills | Status: DC

## 2022-12-13 MED ORDER — POTASSIUM CHLORIDE CRYS ER 10 MEQ PO TBCR: 10.0000 meq | EXTENDED_RELEASE_TABLET | Freq: Every day | ORAL | 1 refills | Status: DC

## 2022-12-13 NOTE — Progress Notes (Signed)
No concerns. Negative PPD.

## 2022-12-13 NOTE — Addendum Note (Signed)
Addended by: Ruhi Kopke L on: 12/13/2022 12:16 PM   Modules accepted: Orders  

## 2022-12-13 NOTE — Progress Notes (Signed)
Pt presented for TB Reading.

## 2022-12-13 NOTE — Progress Notes (Signed)
Potassium is low. Start potassium with the HCTZ and recheck bmp in 2 weeks.   LDL not to goal. CV risk is above 7.5 percent. I would like for you also to start cholesterol medication to help reduce risk of CV. Are you ok with this?   Marland KitchenMarland KitchenThe 10-year ASCVD risk score (Arnett DK, et al., 2019) is: 9.2%   Values used to calculate the score:     Age: 57 years     Sex: Female     Is Non-Hispanic African American: Yes     Diabetic: No     Tobacco smoker: No     Systolic Blood Pressure: 016 mmHg     Is BP treated: Yes     HDL Cholesterol: 67 mg/dL     Total Cholesterol: 228 mg/dL

## 2023-01-01 ENCOUNTER — Telehealth: Payer: Self-pay

## 2023-01-01 ENCOUNTER — Encounter: Payer: BC Managed Care – PPO | Admitting: Physician Assistant

## 2023-01-01 NOTE — Telephone Encounter (Addendum)
Initiated Prior authorization [id number] 2.'5MG'$ /0.5ML pen-injectors Via: Covermymeds Case/Key:[id number]  Status: denied as of 01/01/23 Reason:plan exclusion Notified Pt via: pt does not have Mychart , an attempt to reach was made 01/03/23,01/05/23,01/07/23

## 2023-01-03 ENCOUNTER — Other Ambulatory Visit: Payer: Self-pay | Admitting: Pharmacist

## 2023-01-03 NOTE — Progress Notes (Signed)
Patient appearing on report for [employer] - Hypertension Control report due to last documented ambulatory blood pressure of 160/73 on 12/11/22. Next appointment with PCP is 01/08/23   Outreached patient to discuss hypertension control and medication management. Left voicemail for patient to return my call at their convenience.   Larinda Buttery, PharmD Clinical Pharmacist [facility] [phone]

## 2023-01-08 ENCOUNTER — Encounter: Payer: BC Managed Care – PPO | Admitting: Physician Assistant

## 2023-01-15 ENCOUNTER — Encounter: Payer: BC Managed Care – PPO | Admitting: Physician Assistant

## 2023-01-21 ENCOUNTER — Encounter: Payer: Self-pay | Admitting: Physician Assistant

## 2023-01-21 ENCOUNTER — Ambulatory Visit (INDEPENDENT_AMBULATORY_CARE_PROVIDER_SITE_OTHER): Payer: BC Managed Care – PPO | Admitting: Physician Assistant

## 2023-01-21 VITALS — BP 146/84 | HR 73 | Ht 65.0 in | Wt 201.0 lb

## 2023-01-21 DIAGNOSIS — E782 Mixed hyperlipidemia: Secondary | ICD-10-CM

## 2023-01-21 DIAGNOSIS — I10 Essential (primary) hypertension: Secondary | ICD-10-CM | POA: Diagnosis not present

## 2023-01-21 DIAGNOSIS — Z0000 Encounter for general adult medical examination without abnormal findings: Secondary | ICD-10-CM

## 2023-01-21 DIAGNOSIS — E6609 Other obesity due to excess calories: Secondary | ICD-10-CM | POA: Diagnosis not present

## 2023-01-21 DIAGNOSIS — Z6832 Body mass index (BMI) 32.0-32.9, adult: Secondary | ICD-10-CM

## 2023-01-21 MED ORDER — TOPIRAMATE 50 MG PO TABS: ORAL_TABLET | ORAL | 0 refills | Status: DC

## 2023-01-21 MED ORDER — PHENTERMINE HCL 37.5 MG PO TABS: ORAL_TABLET | ORAL | 0 refills | Status: DC

## 2023-01-21 MED ORDER — LOSARTAN POTASSIUM 25 MG PO TABS: 25.0000 mg | ORAL_TABLET | Freq: Every day | ORAL | 0 refills | Status: DC

## 2023-01-21 NOTE — Progress Notes (Signed)
Annual Wellness Visit     Patient: Tammy Reed, Female    DOB: [birth year], 57 y.o.   MRN: [id number]  Subjective  Chief Complaint  Patient presents with   Annual Exam      HPI Tammy Reed is a 57 y.o. obese female with HTN who presents today for her Annual Wellness Visit. She reports consuming a healthy diet mainly consisting of salads. She goes to the gym 3x/week. She generally feels well. She reports sleeping well. No issues with BM or urinary symptoms. She does have additional problems to discuss today.   She restarted her HCTZ since her last visit 1 month ago but forgot to take medication today.  No issues with this medication. No CP, palpitations, headaches, or vision changes. She does not check her BP at home.   She would like to discuss weight loss medications and feeling fatigued. She was put on topamax at her last visit but feels like it is not helping enough. She likes that it helps her to not drink sodas. She is interested in being on phentermine again. She has gained 4 pounds since her last visit a month ago. Zepbound was not approved by insurance.    Past Medical History:  Diagnosis Date   Hypertension       Medications: Outpatient Medications Prior to Visit  Medication Sig   atorvastatin (LIPITOR) 40 MG tablet Take 1 tablet (40 mg total) by mouth daily.   hydrochlorothiazide (HYDRODIURIL) 25 MG tablet Take 1 tablet (25 mg total) by mouth daily.   linaclotide (LINZESS) 145 MCG CAPS capsule Take 1 capsule (145 mcg total) by mouth daily before breakfast.   potassium chloride (KLOR-CON M) 10 MEQ tablet Take 1 tablet (10 mEq total) by mouth daily.   [DISCONTINUED] topiramate (TOPAMAX) 50 MG tablet Take 1 tablet at bedtime.   [DISCONTINUED] tirzepatide (ZEPBOUND) 2.5 MG/0.5ML Pen Inject 2.5 mg into the skin once a week.   No facility-administered medications prior to visit.    No Known Allergies  Patient Care Team: Lavada Mesi as PCP - General (Family  Medicine)  ROS See HPI      Objective  BP (!) 146/84   Pulse 73   Ht '5\' 5"'$  (1.651 m)   Wt 201 lb (91.2 kg)   SpO2 98%   BMI 33.45 kg/m  BP Readings from Last 3 Encounters:  01/21/23 (!) 146/84  12/13/22 (!) 160/73  12/11/22 (!) 160/73   Wt Readings from Last 3 Encounters:  01/21/23 201 lb (91.2 kg)  12/13/22 198 lb (89.8 kg)  12/11/22 198 lb 0.6 oz (89.8 kg)      Physical Exam Vitals reviewed.  Constitutional:      Appearance: She is obese.  HENT:     Head: Normocephalic and atraumatic.  Cardiovascular:     Rate and Rhythm: Normal rate and regular rhythm.     Pulses: Normal pulses.  Pulmonary:     Effort: Pulmonary effort is normal.     Breath sounds: Normal breath sounds.  Abdominal:     Palpations: Abdomen is soft.     Tenderness: There is no abdominal tenderness.  Musculoskeletal:        General: Normal range of motion.     Cervical back: Neck supple.  Neurological:     Mental Status: She is alert and oriented to person, place, and time.  Psychiatric:        Mood and Affect: Mood normal.     Most recent fall  risk assessment:    01/21/2023    3:02 PM  Fall Risk   Falls in the past year? 0  Number falls in past yr: 0  Injury with Fall? 0  Risk for fall due to : No Fall Risks  Follow up Falls evaluation completed    Most recent depression screenings:    01/21/2023    3:01 PM 12/11/2022    8:22 AM  PHQ 2/9 Scores  PHQ - 2 Score 0 0  PHQ- 9 Score 0      Assessment & Plan   Annual wellness visit done today including the all of the following: Reviewed patient's Family Medical History Reviewed and updated list of patient's medical providers Assessment of cognitive impairment was done Assessed patient's functional ability Established a written schedule for health screening [place] Completed and Reviewed  Exercise Activities and Dietary recommendations  Goals   None     Immunization History  Administered Date(s)  Administered   Influenza,inj,Quad PF,6+ Mos 08/03/2018, 02/14/2021, 12/11/2022   PFIZER(Purple Top)SARS-COV-2 Vaccination 07/14/2020, 08/07/2020, 03/14/2021   PPD Test 12/11/2022   Tdap 07/23/2012, 12/11/2022   Zoster Recombinat (Shingrix) 01/19/2020, 07/14/2020    Health Maintenance  Topic Date Due   COLONOSCOPY (Pts 45-79yr Insurance coverage will need to be confirmed)  Never done   MAMMOGRAM  01/19/2022   COVID-19 Vaccine (4 - 2023-24 season) 02/06/2023 (Originally 07/19/2022)   HIV Screening  12/12/2023 (Originally [birth year]/1983)   PAP SMEAR-Modifier  02/15/2024   DTaP/Tdap/Td (3 - Td or Tdap) 12/11/2032   INFLUENZA VACCINE  Completed   Hepatitis C Screening  Completed   Zoster Vaccines- Shingrix  Completed   HPV VACCINES  Aged Out    CRivkahwas seen today for annual exam.  Diagnoses and all orders for this visit:  Routine physical examination  Class 1 obesity due to excess calories without serious comorbidity with body mass index (BMI) of 32.0 to 32.9 in adult -     topiramate (TOPAMAX) 50 MG tablet; Take 1 tablet in the morning and one tablet in the evening. -     phentermine (ADIPEX-P) 37.5 MG tablet; Take 1/2 tablet daily in the morning.  Essential hypertension, benign -     losartan (COZAAR) 25 MG tablet; Take 1 tablet (25 mg total) by mouth daily.  Mixed hyperlipidemia  ..Marland KitchenDiscussed 150 minutes of exercise a week.  Encouraged vitamin D 1000 units and Calcium '1300mg'$  or 4 servings of dairy a day.  PHQ no concerns  Discussed weight loss medication options  Start 1/2 tablet of Phentermine 37.5 and continue Topamax  Discussed a healthy diet and 150 mins of exercise/week  Discussed weight loss, exercise, and B complex vitamins for sources of energy  BP not to goal Start Losartan 25 mg and continue HCTZ  Encouraged checking BP at home and follow up in 1 month to check in office UTD on flu and covid vaccines  Cologuard and mammogram have been ordered   Return in about 4  weeks (around 02/18/2023) for BP recheck in one month.     JIran Planas PA-C

## 2023-01-21 NOTE — Patient Instructions (Signed)
Goal BP under 140/90.  Add Cozaar for BP.  Increased topamax to twice a day Take 1/2 tablet for phentermine in the morning

## 2023-02-25 ENCOUNTER — Encounter: Payer: Self-pay | Admitting: Physician Assistant

## 2023-02-25 ENCOUNTER — Ambulatory Visit: Payer: BC Managed Care – PPO | Admitting: Physician Assistant

## 2023-02-25 VITALS — BP 158/69 | HR 64 | Ht 65.0 in | Wt 204.0 lb

## 2023-02-25 DIAGNOSIS — Z6832 Body mass index (BMI) 32.0-32.9, adult: Secondary | ICD-10-CM

## 2023-02-25 DIAGNOSIS — Z1211 Encounter for screening for malignant neoplasm of colon: Secondary | ICD-10-CM

## 2023-02-25 DIAGNOSIS — Z1231 Encounter for screening mammogram for malignant neoplasm of breast: Secondary | ICD-10-CM

## 2023-02-25 DIAGNOSIS — E782 Mixed hyperlipidemia: Secondary | ICD-10-CM

## 2023-02-25 DIAGNOSIS — E6609 Other obesity due to excess calories: Secondary | ICD-10-CM

## 2023-02-25 DIAGNOSIS — I10 Essential (primary) hypertension: Secondary | ICD-10-CM | POA: Diagnosis not present

## 2023-02-25 MED ORDER — ATORVASTATIN CALCIUM 40 MG PO TABS: 40.0000 mg | ORAL_TABLET | Freq: Every day | ORAL | 3 refills | Status: DC

## 2023-02-25 MED ORDER — LOSARTAN POTASSIUM 25 MG PO TABS: 25.0000 mg | ORAL_TABLET | Freq: Every day | ORAL | 0 refills | Status: DC

## 2023-02-25 MED ORDER — PHENTERMINE HCL 37.5 MG PO TABS: ORAL_TABLET | ORAL | 0 refills | Status: DC

## 2023-02-25 NOTE — Progress Notes (Signed)
   Established Patient Office Visit  Subjective   Patient ID: Tammy Maraldo, female    DOB: 1966-07-25  Age: 57 y.o. MRN: [id number]  Chief Complaint  Patient presents with  . Follow-up    HPI  {History (Optional):23778}  ROS    Objective:     BP (!) 158/69   Pulse 64   Ht 5\' 5"  (1.651 m)   Wt 204 lb (92.5 kg)   SpO2 99%   BMI 33.95 kg/m  BP Readings from Last 3 Encounters:  02/25/23 (!) 158/69  01/21/23 (!) 146/84  12/13/22 (!) 160/73   Wt Readings from Last 3 Encounters:  02/25/23 204 lb (92.5 kg)  01/21/23 201 lb (91.2 kg)  12/13/22 198 lb (89.8 kg)      Physical Exam    The 10-year ASCVD risk score (Arnett DK, et al., 2019) is: 9.4%    Assessment & Plan:      Tandy Gaw, PA-C

## 2023-04-09 ENCOUNTER — Ambulatory Visit: Payer: BC Managed Care – PPO

## 2023-06-26 ENCOUNTER — Other Ambulatory Visit: Payer: Self-pay | Admitting: Physician Assistant

## 2023-06-26 DIAGNOSIS — K5901 Slow transit constipation: Secondary | ICD-10-CM

## 2023-06-26 DIAGNOSIS — I10 Essential (primary) hypertension: Secondary | ICD-10-CM

## 2023-06-26 DIAGNOSIS — E876 Hypokalemia: Secondary | ICD-10-CM

## 2023-06-26 NOTE — Telephone Encounter (Signed)
Please call pt and inform her that she was supposed to have her her labs rechecked back in February due to low potassium. She will need to get this done. Also she was given a years supply of hydrochlorothiazide per our records.   Thanks.

## 2023-06-26 NOTE — Telephone Encounter (Signed)
Pt called.  She is requesting refill on both bp meds and Linzess. Pharmacy: [facility]

## 2023-06-27 ENCOUNTER — Other Ambulatory Visit: Payer: Self-pay | Admitting: Physician Assistant

## 2023-06-27 DIAGNOSIS — K5901 Slow transit constipation: Secondary | ICD-10-CM

## 2023-06-27 NOTE — Telephone Encounter (Signed)
 Attempted call to patient. Left a voice mail message  to return our call.

## 2023-06-30 ENCOUNTER — Ambulatory Visit: Payer: BC Managed Care – PPO | Admitting: Physician Assistant

## 2023-06-30 ENCOUNTER — Encounter: Payer: Self-pay | Admitting: Physician Assistant

## 2023-06-30 VITALS — BP 146/66 | HR 74 | Ht 65.0 in | Wt 209.0 lb

## 2023-06-30 DIAGNOSIS — E6609 Other obesity due to excess calories: Secondary | ICD-10-CM | POA: Diagnosis not present

## 2023-06-30 DIAGNOSIS — Z1211 Encounter for screening for malignant neoplasm of colon: Secondary | ICD-10-CM

## 2023-06-30 DIAGNOSIS — R011 Cardiac murmur, unspecified: Secondary | ICD-10-CM | POA: Diagnosis not present

## 2023-06-30 DIAGNOSIS — Z6832 Body mass index (BMI) 32.0-32.9, adult: Secondary | ICD-10-CM

## 2023-06-30 DIAGNOSIS — Z1231 Encounter for screening mammogram for malignant neoplasm of breast: Secondary | ICD-10-CM

## 2023-06-30 DIAGNOSIS — E876 Hypokalemia: Secondary | ICD-10-CM

## 2023-06-30 DIAGNOSIS — I10 Essential (primary) hypertension: Secondary | ICD-10-CM | POA: Diagnosis not present

## 2023-06-30 DIAGNOSIS — I878 Other specified disorders of veins: Secondary | ICD-10-CM

## 2023-06-30 DIAGNOSIS — K5901 Slow transit constipation: Secondary | ICD-10-CM | POA: Diagnosis not present

## 2023-06-30 MED ORDER — POTASSIUM CHLORIDE CRYS ER 10 MEQ PO TBCR
10.0000 meq | EXTENDED_RELEASE_TABLET | Freq: Every day | ORAL | 1 refills | Status: DC
Start: 2023-06-30 — End: 2024-02-12

## 2023-06-30 MED ORDER — LINACLOTIDE 145 MCG PO CAPS
145.0000 ug | ORAL_CAPSULE | Freq: Every day | ORAL | 3 refills | Status: DC
Start: 2023-06-30 — End: 2024-02-11

## 2023-06-30 MED ORDER — TOPIRAMATE 50 MG PO TABS
ORAL_TABLET | ORAL | 3 refills | Status: DC
Start: 2023-06-30 — End: 2024-02-11

## 2023-06-30 MED ORDER — AMLODIPINE BESYLATE 2.5 MG PO TABS
2.5000 mg | ORAL_TABLET | Freq: Every day | ORAL | 0 refills | Status: DC
Start: 2023-06-30 — End: 2023-08-11

## 2023-06-30 MED ORDER — HYDROCHLOROTHIAZIDE 25 MG PO TABS: 25.0000 mg | ORAL_TABLET | Freq: Every day | ORAL | 3 refills | Status: DC

## 2023-06-30 NOTE — Progress Notes (Signed)
3d

## 2023-06-30 NOTE — Progress Notes (Signed)
Established Patient Office Visit  Subjective   Patient ID: Tammy Reed, female    DOB: 1966/05/20  Age: 57 y.o. MRN: [id number]  Chief Complaint  Patient presents with   Medical Management of Chronic Issues    Weight management     HPI Pt is a 57 yo female who presents to the clinic for follow up and medication refills.   She continues to be frustrated with weight. She would like phentermine refill again. She has been out for 2 months. She has gained 5 more pounds. She did not tolerate cozaar for BP she had side effects of nausea every time she took it. She is only taking HcTZ. She is trying to stay active and eat healthier. Zepbound denied by insurance.   She denies any CP, palpitations, headaches or vision changes. She does have some lower extermity swelling in legs off and on.   Continues to have some problems with constipation but linzess helps a lot and would like refills.  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   Chest tightness 01/08/2016   Migraine without aura and with status migrainosus, not intractable 01/08/2016   Atypical chest pain 01/08/2016   Bilateral carpal tunnel syndrome 03/28/2016   Morning joint stiffness of hand 12/17/2017   Bilateral hand pain 12/17/2017   Hyperlipidemia 08/04/2018   Bilateral impacted cerumen 01/19/2020   Pilar cyst 01/21/2020   Hypokalemia 01/21/2020   Class 2 severe obesity due to excess calories with serious comorbidity and body mass index (BMI) of 35.0 to 35.9 in adult [facility]) 03/28/2021   Long QT interval  10/22/2021   Newly recognized heart murmur 06/30/2023   Chronic venous stasis 06/30/2023   Resolved Ambulatory Problems    Diagnosis Date Noted   SUBCONJUNCTIVAL HEMORRHAGE 07/23/2011   BMI 39.0-39.9,adult 04/08/2014   Past Medical History:  Diagnosis Date   Hypertension      ROS See HPI.    Objective:     BP (!) 146/66   Pulse 74   Ht 5\' 5"  (1.651 m)   Wt 209 lb (94.8 kg)   SpO2 99%   BMI 34.78 kg/m  BP Readings from Last 3 Encounters:  06/30/23 (!) 146/66  02/25/23 (!) 158/69  01/21/23 (!) 146/84   Wt Readings from Last 3 Encounters:  06/30/23 209 lb (94.8 kg)  02/25/23 204 lb (92.5 kg)  01/21/23 201 lb (91.2 kg)      Physical Exam Constitutional:      Appearance: Normal appearance. She is obese.  HENT:     Head: Normocephalic.  Cardiovascular:     Rate and Rhythm: Normal rate and regular rhythm.     Heart sounds: Murmur heard.     Comments: 3/6 SEM Pulmonary:     Effort: Pulmonary effort is normal.     Breath sounds: Normal breath sounds.  Musculoskeletal:     Right lower leg: Edema present.     Left lower leg: Edema present.     Comments: Bilateral scant edema with varicose and superficial veins  Neurological:     Mental  Status: She is alert.  Psychiatric:        Mood and Affect: Mood normal.      The 10-year ASCVD risk score (Arnett DK, et al., 2019) is: 7.2%    Assessment & Plan:  Marland KitchenMarland KitchenVal was seen today for medical management of chronic issues.  Diagnoses and all orders for this visit:  Newly recognized heart murmur -     ECHOCARDIOGRAM COMPLETE; Future -     Cardiac Stress Test: Informed Consent Details: Physician/Practitioner Attestation; Transcribe to consent form and obtain patient signature  Essential hypertension, benign -     hydrochlorothiazide (HYDRODIURIL) 25 MG tablet; Take 1 tablet (25 mg total) by mouth daily. -     amLODipine (NORVASC) 2.5 MG tablet; Take 1 tablet (2.5 mg total) by mouth daily. -     ECHOCARDIOGRAM  COMPLETE; Future -     Cardiac Stress Test: Informed Consent Details: Physician/Practitioner Attestation; Transcribe to consent form and obtain patient signature  Class 1 obesity due to excess calories without serious comorbidity with body mass index (BMI) of 32.0 to 32.9 in adult -     topiramate (TOPAMAX) 50 MG tablet; Take 1 tablet in the morning and one tablet in the evening.  Slow transit constipation -     linaclotide (LINZESS) 145 MCG CAPS capsule; Take 1 capsule (145 mcg total) by mouth daily before breakfast.  Visit for screening mammogram -     MM 3D SCREENING MAMMOGRAM BILATERAL BREAST; Future  Colon cancer screening -     Ambulatory referral to Gastroenterology  Hypokalemia -     potassium chloride (KLOR-CON M) 10 MEQ tablet; Take 1 tablet (10 mEq total) by mouth daily. -     COMPLETE METABOLIC PANEL WITH GFR -     [id number]  Chronic venous stasis -     ECHOCARDIOGRAM COMPLETE; Future -     Cardiac Stress Test: Informed Consent Details: Physician/Practitioner Attestation; Transcribe to consent form and obtain patient signature   Not able to tolerate cozaar BP still elevated Add norvasc with HcTZ Follow up in 2 weeks New heart murmur today- echo ordered  Chronic venous stasis- wear compression stockings  Not taking potassium-recheck level today  Constipation ongoing-refilled linzess.   Marland Kitchen.Discussed low carb diet with 1500 calories and 80g of protein.  Exercising at least 150 minutes a week.  [employer] could be a Chief Technology Officer.  Continue topamax Hold phentermine refill until BP improved GLP denied by insurance   Need for colonoscopy and mammogram. Both ordered today.  Return in about 2 weeks (around 07/14/2023) for BP.    Tandy Gaw, PA-C

## 2023-06-30 NOTE — Patient Instructions (Addendum)
Will get echo of heart Will get CMP Start norvasc one tablet daily in the morning  Recheck 2 weeks nurse visit

## 2023-06-30 NOTE — Telephone Encounter (Signed)
Patient in office for appointment with provider today 06/30/23.

## 2023-07-01 NOTE — Progress Notes (Signed)
Potassium is really low. Start potassium and will recheck in 2 weeks.

## 2023-07-10 DIAGNOSIS — H25811 Combined forms of age-related cataract, right eye: Secondary | ICD-10-CM | POA: Diagnosis not present

## 2023-07-14 ENCOUNTER — Ambulatory Visit: Payer: BC Managed Care – PPO

## 2023-07-15 ENCOUNTER — Encounter (HOSPITAL_COMMUNITY): Payer: Self-pay | Admitting: Physician Assistant

## 2023-07-28 ENCOUNTER — Ambulatory Visit: Payer: BC Managed Care – PPO

## 2023-07-29 ENCOUNTER — Encounter (HOSPITAL_COMMUNITY): Payer: Self-pay | Admitting: Physician Assistant

## 2023-07-29 ENCOUNTER — Telehealth (HOSPITAL_COMMUNITY): Payer: Self-pay | Admitting: Physician Assistant

## 2023-07-29 NOTE — Telephone Encounter (Signed)
Just an FYI. We have made several attempts to contact this patient including sending a letter to schedule or reschedule their echocardiogram. We will be removing the patient from the echo WQ.    07/15/23 MAILED LETTER [facility]  07/10/23 [facility] to schedule x 3 @ 12:02/[facility]  07/07/23 [facility] to schedule @ 10:01/[facility]  07/01/23 [facility] to schedule @ 11:02/LBW      Thank you

## 2023-07-31 ENCOUNTER — Ambulatory Visit: Payer: BC Managed Care – PPO

## 2023-08-07 ENCOUNTER — Other Ambulatory Visit: Payer: Self-pay | Admitting: Physician Assistant

## 2023-08-07 DIAGNOSIS — E6609 Other obesity due to excess calories: Secondary | ICD-10-CM

## 2023-08-08 ENCOUNTER — Ambulatory Visit: Payer: BC Managed Care – PPO

## 2023-08-11 ENCOUNTER — Ambulatory Visit (INDEPENDENT_AMBULATORY_CARE_PROVIDER_SITE_OTHER): Payer: BC Managed Care – PPO

## 2023-08-11 VITALS — BP 127/73

## 2023-08-11 DIAGNOSIS — Z1211 Encounter for screening for malignant neoplasm of colon: Secondary | ICD-10-CM

## 2023-08-11 DIAGNOSIS — I10 Essential (primary) hypertension: Secondary | ICD-10-CM

## 2023-08-11 DIAGNOSIS — Z6832 Body mass index (BMI) 32.0-32.9, adult: Secondary | ICD-10-CM

## 2023-08-11 DIAGNOSIS — Z1152 Encounter for screening for COVID-19: Secondary | ICD-10-CM

## 2023-08-11 DIAGNOSIS — E6609 Other obesity due to excess calories: Secondary | ICD-10-CM | POA: Diagnosis not present

## 2023-08-11 LAB — POC COVID19 BINAXNOW: SARS Coronavirus 2 Ag: NEGATIVE

## 2023-08-11 MED ORDER — LOSARTAN POTASSIUM 25 MG PO TABS
25.0000 mg | ORAL_TABLET | Freq: Every day | ORAL | 0 refills | Status: DC
Start: 2023-08-11 — End: 2024-02-11

## 2023-08-11 MED ORDER — PHENTERMINE HCL 37.5 MG PO TABS: ORAL_TABLET | ORAL | 0 refills | Status: DC

## 2023-08-11 MED ORDER — AMLODIPINE BESYLATE 2.5 MG PO TABS
2.5000 mg | ORAL_TABLET | Freq: Every day | ORAL | 0 refills | Status: DC
Start: 2023-08-11 — End: 2023-12-02

## 2023-08-11 NOTE — Patient Instructions (Signed)
Return in 3 months for for HTN follow up with provider.

## 2023-08-11 NOTE — Progress Notes (Signed)
BP looks great Stay on norvasc, losartan, hydrochlorothiazide Recheck BP in 3 months  Continue phentermine 15mg  for weight loss with topamax.   Marland KitchenLynden Reed was seen today for hypertension.  Diagnoses and all orders for this visit:  Essential hypertension, benign -     amLODipine (NORVASC) 2.5 MG tablet; Take 1 tablet (2.5 mg total) by mouth daily. -     losartan (COZAAR) 25 MG tablet; Take 1 tablet (25 mg total) by mouth daily. For blood pressure.  Class 1 obesity due to excess calories without serious comorbidity with body mass index (BMI) of 32.0 to 32.9 in adult -     phentermine (ADIPEX-P) 37.5 MG tablet; Take 1/2 tablet daily in the morning.  Encounter for screening for COVID-19 -     POC COVID-19  Colon cancer screening -     Ambulatory referral to Gastroenterology

## 2023-08-11 NOTE — Progress Notes (Signed)
Established Patient Office Visit  Subjective   Patient ID: Tammy Reed, female    DOB: Sep 09, 1966  Age: 57 y.o. MRN: [id number]  Chief Complaint  Patient presents with   Hypertension     BP check nurse visit.     HPI  BP check nurse visit. Patient denies chest pain shortness of breath, dizziness, palpitations or medication problems.  Patient states she is taking amlodipine 2.5mg  , hydrochlorothiazide 25mg , and losartan(cozaar) 25mg   and denies any problems with cozaar or any of other medications. Patient states she had Covid last week  and requesting a note for work and testing that she is now negative. Per Tandy Gaw can write letter that patient reported to [place] that she was Covid positive last week and can do a Covid test today. Patient states she does need refill of phentermine and inquiring about Linzess PA.   ROS    Objective:     BP 127/73    Physical Exam   Results for orders placed or performed in visit on 08/11/23  POC COVID-19  Result Value Ref Range   SARS Coronavirus 2 Ag Negative Negative      The 10-year ASCVD risk score (Arnett DK, et al., 2019) is: 4.5%    Assessment & Plan:  BP check initial reading = 134/63. Second reading = 127/73 Problem List Items Addressed This Visit       Other   Class 1 obesity due to excess calories without serious comorbidity with body mass index (BMI) of 32.0 to 32.9 in adult   Other Visit Diagnoses     Encounter for screening for COVID-19    -  Primary   Relevant Orders   POC COVID-19 (Completed)       Return in about 3 months (around 11/10/2023) for HTN follow up with Tandy Gaw.    Elizabeth Palau, LPN

## 2023-08-28 ENCOUNTER — Telehealth: Payer: Self-pay

## 2023-08-28 NOTE — Telephone Encounter (Signed)
Has PA for Linzess been done for this patient?

## 2023-09-03 NOTE — Telephone Encounter (Signed)
Initiated Prior authorization LOV:FIEPPIR capsules Via: Covermymeds Case/Key: [id number] Status: approved  as of 09/03/23 Reason:08/11/23-08/10/24 Notified Pt via: Mychart

## 2023-10-27 ENCOUNTER — Ambulatory Visit: Payer: BC Managed Care – PPO | Admitting: Physician Assistant

## 2023-10-31 ENCOUNTER — Ambulatory Visit: Payer: BC Managed Care – PPO | Admitting: Physician Assistant

## 2023-11-14 ENCOUNTER — Encounter: Payer: Self-pay | Admitting: Physician Assistant

## 2023-11-14 ENCOUNTER — Ambulatory Visit: Payer: BC Managed Care – PPO | Admitting: Physician Assistant

## 2023-11-14 VITALS — BP 142/60 | HR 66 | Temp 98.1°F | Ht 65.0 in | Wt 222.0 lb

## 2023-11-14 DIAGNOSIS — R0981 Nasal congestion: Secondary | ICD-10-CM | POA: Insufficient documentation

## 2023-11-14 DIAGNOSIS — H6993 Unspecified Eustachian tube disorder, bilateral: Secondary | ICD-10-CM | POA: Insufficient documentation

## 2023-11-14 MED ORDER — METHYLPREDNISOLONE 4 MG PO TBPK
ORAL_TABLET | ORAL | 0 refills | Status: DC
Start: 2023-11-14 — End: 2024-02-11

## 2023-11-14 MED ORDER — FLUTICASONE PROPIONATE 50 MCG/ACT NA SUSP
2.0000 | Freq: Every day | NASAL | 0 refills | Status: AC
Start: 2023-11-14 — End: ?

## 2023-11-14 NOTE — Patient Instructions (Signed)
Eustachian Tube Dysfunction  Eustachian tube dysfunction refers to a condition in which a blockage develops in the narrow passage that connects the middle ear to the back of the nose (eustachian tube). The eustachian tube regulates air pressure in the middle ear by letting air move between the ear and nose. It also helps to drain fluid from the middle ear space. Eustachian tube dysfunction can affect one or both ears. When the eustachian tube does not function properly, air pressure, fluid, or both can build up in the middle ear. What are the causes? This condition occurs when the eustachian tube becomes blocked or cannot open normally. Common causes of this condition include: Ear infections. Colds and other infections that affect the nose, mouth, and throat (upper respiratory tract). Allergies. Irritation from cigarette smoke. Irritation from stomach acid coming up into the esophagus (gastroesophageal reflux). The esophagus is the part of the body that moves food from the mouth to the stomach. Sudden changes in air pressure, such as from descending in an airplane or scuba diving. Abnormal growths in the nose or throat, such as: Growths that line the nose (nasal polyps). Abnormal growth of cells (tumors). Enlarged tissue at the back of the throat (adenoids). What increases the risk? You are more likely to develop this condition if: You smoke. You are overweight. You are a child who has: Certain birth defects of the mouth, such as cleft palate. Large tonsils or adenoids. What are the signs or symptoms? Common symptoms of this condition include: A feeling of fullness in the ear. Ear pain. Clicking or popping noises in the ear. Ringing in the ear (tinnitus). Hearing loss. Loss of balance. Dizziness. Symptoms may get worse when the air pressure around you changes, such as when you travel to an area of high elevation, fly on an airplane, or go scuba diving. How is this diagnosed? This  condition may be diagnosed based on: Your symptoms. A physical exam of your ears, nose, and throat. Tests, such as those that measure: The movement of your eardrum. Your hearing (audiometry). How is this treated? Treatment depends on the cause and severity of your condition. In mild cases, you may relieve your symptoms by moving air into your ears. This is called "popping the ears." In more severe cases, or if you have symptoms of fluid in your ears, treatment may include: Medicines to relieve congestion (decongestants). Medicines that treat allergies (antihistamines). Nasal sprays or ear drops that contain medicines that reduce swelling (steroids). A procedure to drain the fluid in your eardrum. In this procedure, a small tube may be placed in the eardrum to: Drain the fluid. Restore the air in the middle ear space. A procedure to insert a balloon device through the nose to inflate the opening of the eustachian tube (balloon dilation). Follow these instructions at home: Lifestyle Do not do any of the following until your health care provider approves: Travel to high altitudes. Fly in airplanes. Work in a pressurized cabin or room. Scuba dive. Do not use any products that contain nicotine or tobacco. These products include cigarettes, chewing tobacco, and vaping devices, such as e-cigarettes. If you need help quitting, ask your health care provider. Keep your ears dry. Wear fitted earplugs during showering and bathing. Dry your ears completely after. General instructions Take over-the-counter and prescription medicines only as told by your health care provider. Use techniques to help pop your ears as recommended by your health care provider. These may include: Chewing gum. Yawning. Frequent, forceful swallowing.   Closing your mouth, holding your nose closed, and gently blowing as if you are trying to blow air out of your nose. Keep all follow-up visits. This is important. Contact a  health care provider if: Your symptoms do not go away after treatment. Your symptoms come back after treatment. You are unable to pop your ears. You have: A fever. Pain in your ear. Pain in your head or neck. Fluid draining from your ear. Your hearing suddenly changes. You become very dizzy. You lose your balance. Get help right away if: You have a sudden, severe increase in any of your symptoms. Summary Eustachian tube dysfunction refers to a condition in which a blockage develops in the eustachian tube. It can be caused by ear infections, allergies, inhaled irritants, or abnormal growths in the nose or throat. Symptoms may include ear pain or fullness, hearing loss, or ringing in the ears. Mild cases are treated with techniques to unblock the ears, such as yawning or chewing gum. More severe cases are treated with medicines or procedures. This information is not intended to replace advice given to you by your health care provider. Make sure you discuss any questions you have with your health care provider. Document Revised: 01/15/2021 Document Reviewed: 01/15/2021 Elsevier Patient Education  2024 [employer]  

## 2023-11-14 NOTE — Progress Notes (Signed)
Acute Office Visit  Subjective:     Patient ID: Tammy Reed, female    DOB: [birth year], 57 y.o.   MRN: [id number]  Chief Complaint  Patient presents with   Ear Pain    Bi lat ear pain onset 2 weeks    HPI Patient is in today for 2 weeks of bilateral ear pressure and popping. She recently recovered from covid. She now has ear pressure/pain and nasal congestion. No fever, chills, body aches, cough. She is not taking anything to make better.   .. 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   Chest tightness 01/08/2016   Migraine without aura and with status migrainosus, not intractable 01/08/2016   Atypical chest pain 01/08/2016   Bilateral carpal tunnel syndrome 03/28/2016   Morning joint stiffness of hand 12/17/2017   Bilateral hand pain 12/17/2017   Hyperlipidemia 08/04/2018   Bilateral impacted cerumen 01/19/2020   Pilar cyst 01/21/2020   Hypokalemia 01/21/2020   Class 2 severe obesity due to excess calories with serious comorbidity and body mass index (BMI) of 35.0 to 35.9 in adult (HCC) 03/28/2021   Long QT interval 10/22/2021   Newly recognized heart murmur 06/30/2023   Chronic venous stasis 06/30/2023   Nasal congestion 11/14/2023   ETD (Eustachian tube dysfunction), bilateral 11/14/2023   Resolved Ambulatory Problems    Diagnosis Date Noted   SUBCONJUNCTIVAL HEMORRHAGE 07/23/2011   BMI 39.0-39.9,adult 04/08/2014   Past Medical History:  Diagnosis Date   Hypertension      ROS  See HPI.     Objective:    BP (!) 142/60   Pulse 66   Temp  98.1 F (36.7 C) (Oral)   Ht 5\' 5"  (1.651 m)   Wt 222 lb (100.7 kg)   SpO2 99%   BMI 36.94 kg/m  BP Readings from Last 3 Encounters:  11/14/23 (!) 142/60  08/11/23 127/73  06/30/23 (!) 146/66   Wt Readings from Last 3 Encounters:  11/14/23 222 lb (100.7 kg)  06/30/23 209 lb (94.8 kg)  02/25/23 204 lb (92.5 kg)      Physical Exam Constitutional:      Appearance: Normal appearance. She is obese.  HENT:     Head: Normocephalic.     Right Ear: Ear canal and external ear normal. There is no impacted cerumen.     Left Ear: Ear canal and external ear normal. There is no impacted cerumen.     Ears:     Comments: TM's dull bilaterally with some bulging.     Nose: Congestion present.     Mouth/Throat:     Mouth: Mucous membranes are moist.     Pharynx: No oropharyngeal exudate or posterior oropharyngeal erythema.  Eyes:     Conjunctiva/sclera: Conjunctivae normal.  Cardiovascular:     Rate and Rhythm: Normal rate and regular rhythm.  Pulmonary:     Effort: Pulmonary effort is normal.     Breath sounds: Normal breath sounds.  Neurological:     General: No focal deficit present.     Mental Status: She is  alert and oriented to person, place, and time.  Psychiatric:        Mood and Affect: Mood normal.          Assessment & Plan:  Marland KitchenMarland KitchenPrachi was seen today for ear pain.  Diagnoses and all orders for this visit:  ETD (Eustachian tube dysfunction), bilateral -     fluticasone (FLONASE) 50 MCG/ACT nasal spray; Place 2 sprays into both nostrils daily. -     methylPREDNISolone (MEDROL DOSEPAK) 4 MG TBPK tablet; Take as directed by package insert.  Nasal congestion -     fluticasone (FLONASE) 50 MCG/ACT nasal spray; Place 2 sprays into both nostrils daily.  Start with flonase for a few days and only add medrol dose pack if needed. HO given Follow up if symptoms worsen or do not resolve.    Tandy Gaw, PA-C

## 2023-12-01 ENCOUNTER — Other Ambulatory Visit: Payer: Self-pay | Admitting: Physician Assistant

## 2023-12-01 DIAGNOSIS — I10 Essential (primary) hypertension: Secondary | ICD-10-CM

## 2024-01-27 ENCOUNTER — Encounter: Admitting: Physician Assistant

## 2024-02-04 ENCOUNTER — Encounter: Admitting: Physician Assistant

## 2024-02-10 ENCOUNTER — Encounter: Admitting: Physician Assistant

## 2024-02-11 ENCOUNTER — Telehealth: Payer: Self-pay

## 2024-02-11 ENCOUNTER — Ambulatory Visit: Admitting: Physician Assistant

## 2024-02-11 ENCOUNTER — Other Ambulatory Visit (HOSPITAL_COMMUNITY): Payer: Self-pay

## 2024-02-11 ENCOUNTER — Encounter: Payer: Self-pay | Admitting: Physician Assistant

## 2024-02-11 ENCOUNTER — Other Ambulatory Visit: Payer: Self-pay | Admitting: Physician Assistant

## 2024-02-11 VITALS — BP 152/68 | HR 69 | Ht 65.0 in | Wt 216.0 lb

## 2024-02-11 DIAGNOSIS — M25561 Pain in right knee: Secondary | ICD-10-CM

## 2024-02-11 DIAGNOSIS — K5901 Slow transit constipation: Secondary | ICD-10-CM | POA: Diagnosis not present

## 2024-02-11 DIAGNOSIS — Z1211 Encounter for screening for malignant neoplasm of colon: Secondary | ICD-10-CM

## 2024-02-11 DIAGNOSIS — Z0000 Encounter for general adult medical examination without abnormal findings: Secondary | ICD-10-CM | POA: Diagnosis not present

## 2024-02-11 DIAGNOSIS — I10 Essential (primary) hypertension: Secondary | ICD-10-CM | POA: Diagnosis not present

## 2024-02-11 DIAGNOSIS — G8929 Other chronic pain: Secondary | ICD-10-CM

## 2024-02-11 DIAGNOSIS — E66812 Obesity, class 2: Secondary | ICD-10-CM | POA: Diagnosis not present

## 2024-02-11 DIAGNOSIS — Z6835 Body mass index (BMI) 35.0-35.9, adult: Secondary | ICD-10-CM

## 2024-02-11 DIAGNOSIS — M25562 Pain in left knee: Secondary | ICD-10-CM

## 2024-02-11 DIAGNOSIS — Z1231 Encounter for screening mammogram for malignant neoplasm of breast: Secondary | ICD-10-CM

## 2024-02-11 MED ORDER — DICLOFENAC SODIUM 1 % EX GEL: 4.0000 g | Freq: Four times a day (QID) | CUTANEOUS | 1 refills | Status: AC

## 2024-02-11 MED ORDER — LINACLOTIDE 145 MCG PO CAPS: 145.0000 ug | ORAL_CAPSULE | Freq: Every day | ORAL | 3 refills | Status: AC

## 2024-02-11 MED ORDER — ZEPBOUND 2.5 MG/0.5ML ~~LOC~~ SOAJ: 2.5000 mg | SUBCUTANEOUS | 0 refills | Status: DC

## 2024-02-11 MED ORDER — AMLODIPINE BESYLATE 2.5 MG PO TABS: 2.5000 mg | ORAL_TABLET | Freq: Every day | ORAL | 1 refills | Status: DC

## 2024-02-11 MED ORDER — VALSARTAN-HYDROCHLOROTHIAZIDE 160-25 MG PO TABS: 1.0000 | ORAL_TABLET | Freq: Every day | ORAL | 1 refills | Status: DC

## 2024-02-11 NOTE — Telephone Encounter (Signed)
 Pharmacy Patient Advocate Encounter   Received notification from Onbase that prior authorization for Linzess 145 is required/requested.   Insurance verification completed.   The patient is insured through [facility] .   Per test claim: PA required; PA submitted to above mentioned insurance via CoverMyMeds Key/confirmation #/EOC [facility] Status is pending

## 2024-02-11 NOTE — Progress Notes (Signed)
 Complete physical exam  Patient: Tammy Reed   DOB: 08-15-1966   58 y.o. Female  MRN: [id number]  Subjective:    Chief Complaint  Patient presents with   Annual Exam    Labs , rx refills    Tammy Reed is a 58 y.o. female who presents today for a complete physical exam. She reports consuming a general diet. The patient does not participate in regular exercise at present. She generally feels well. She reports sleeping well. She does have additional problems to discuss today.   She would like to start phentermine again for weight loss. Her insurance has not paided well for other weight loss medications. She is not currently exercising. She is not on any diet currently. She has been on diets here and there over the years.    Most recent fall risk assessment:    02/16/2024   11:23 AM  Fall Risk   Falls in the past year? 0  Number falls in past yr: 0  Injury with Fall? 0  Risk for fall due to : No Fall Risks  Follow up Falls evaluation completed     Most recent depression screenings:    02/16/2024   11:23 AM 06/30/2023   11:03 AM  PHQ 2/9 Scores  PHQ - 2 Score 0 0  PHQ- 9 Score 0     Vision:Within last year and Dental: No current dental problems and Receives regular dental care  Patient Active Problem List   Diagnosis Date Noted   Chronic pain of both knees 02/16/2024   Nasal congestion 11/14/2023   ETD (Eustachian tube dysfunction), bilateral 11/14/2023   Newly recognized heart murmur 06/30/2023   Chronic venous stasis 06/30/2023   Long QT interval 10/22/2021   Class 2 severe obesity due to excess calories with serious comorbidity and body mass index (BMI) of 35.0 to 35.9 in adult (HCC) 03/28/2021   Pilar cyst 01/21/2020   Hypokalemia 01/21/2020   Bilateral impacted cerumen 01/19/2020   Hyperlipidemia 08/04/2018   Morning joint stiffness of hand 12/17/2017   Bilateral hand pain 12/17/2017   Bilateral carpal tunnel syndrome 03/28/2016   Pilar and trichilemmal cysts  01/08/2016   Chest tightness 01/08/2016   Migraine without aura and with status migrainosus, not intractable 01/08/2016   Atypical chest pain 01/08/2016   Class 1 obesity due to excess calories without serious comorbidity with body mass index (BMI) of 32.0 to 32.9 in adult 08/14/2015   Stress at work 08/14/2015   Dental caries 06/14/2014   Slow transit constipation 04/08/2014   Abnormal weight gain 04/08/2014   Essential hypertension, benign 03/03/2013   Migraine with aura 10/21/2012   Obesity (BMI 30-39.9) 10/15/2011   Past Medical History:  Diagnosis Date   Hypertension    No past surgical history on file. Family Status  Relation Name Status   Mother  Alive   Sister Bonita Quin Alive   Brother Onalee Hua Alive   Brother Homero Fellers Alive   Sister Steward Drone Alive  No partnership data on file   Family History  Problem Relation Age of Onset   Heart disease Mother    Depression Brother    Allergies  Allergen Reactions   Losartan     Nausea/vision changes.       Patient Care Team: Nolene Ebbs as PCP - General (Family Medicine)   Outpatient Medications Prior to Visit  Medication Sig   fluticasone (FLONASE) 50 MCG/ACT nasal spray Place 2 sprays into both nostrils daily.   [DISCONTINUED]  amLODipine (NORVASC) 2.5 MG tablet Take 1 tablet by mouth once daily   [DISCONTINUED] atorvastatin (LIPITOR) 40 MG tablet Take 1 tablet (40 mg total) by mouth daily. For cholesterol.   [DISCONTINUED] linaclotide (LINZESS) 145 MCG CAPS capsule Take 1 capsule (145 mcg total) by mouth daily before breakfast.   [DISCONTINUED] phentermine (ADIPEX-P) 37.5 MG tablet Take 1/2 tablet daily in the morning.   [DISCONTINUED] potassium chloride (KLOR-CON M) 10 MEQ tablet Take 1 tablet (10 mEq total) by mouth daily.   [DISCONTINUED] hydrochlorothiazide (HYDRODIURIL) 25 MG tablet Take 1 tablet (25 mg total) by mouth daily.   [DISCONTINUED] losartan (COZAAR) 25 MG tablet Take 1 tablet (25 mg total) by mouth  daily. For blood pressure.   [DISCONTINUED] methylPREDNISolone (MEDROL DOSEPAK) 4 MG TBPK tablet Take as directed by package insert.   [DISCONTINUED] topiramate (TOPAMAX) 50 MG tablet Take 1 tablet in the morning and one tablet in the evening.   No facility-administered medications prior to visit.    Review of Systems  All other systems reviewed and are negative.         Objective:     BP (!) 152/68   Pulse 69   Ht 5\' 5"  (1.651 m)   Wt 216 lb (98 kg)   SpO2 99%   BMI 35.94 kg/m  BP Readings from Last 3 Encounters:  02/11/24 (!) 152/68  11/14/23 (!) 142/60  08/11/23 127/73   Wt Readings from Last 3 Encounters:  02/11/24 216 lb (98 kg)  11/14/23 222 lb (100.7 kg)  06/30/23 209 lb (94.8 kg)      Physical Exam  BP (!) 152/68   Pulse 69   Ht 5\' 5"  (1.651 m)   Wt 216 lb (98 kg)   SpO2 99%   BMI 35.94 kg/m   General Appearance:    Alert, cooperative, obese no distress, appears stated age  Head:    Normocephalic, without obvious abnormality, atraumatic  Eyes:    PERRL, conjunctiva/corneas clear, EOM's intact, fundi    benign, both eyes  Ears:    Normal TM's and external ear canals, both ears  Nose:   Nares normal, septum midline, mucosa normal, no drainage    or sinus tenderness  Throat:   Lips, mucosa, and tongue normal; teeth and gums normal  Neck:   Supple, symmetrical, trachea midline, no adenopathy;    thyroid:  no enlargement/tenderness/nodules; no carotid   bruit or JVD  Back:     Symmetric, no curvature, ROM normal, no CVA tenderness  Lungs:     Clear to auscultation bilaterally, respirations unlabored  Chest Wall:    No tenderness or deformity   Heart:    Regular rate and rhythm, S1 and S2 normal, no murmur, rub   or gallop     Abdomen:     Soft, non-tender, bowel sounds active all four quadrants,    no masses, no organomegaly        Extremities:   Extremities normal, atraumatic, no cyanosis or edema  Pulses:   2+ and symmetric all extremities   Skin:   Skin color, texture, turgor normal, no rashes or lesions  Lymph nodes:   Cervical, supraclavicular, and axillary nodes normal  Neurologic:   CNII-XII intact, normal strength, sensation and reflexes    throughout      Assessment & Plan:    Routine Health Maintenance and Physical Exam  Immunization History  Administered Date(s) Administered   Influenza,inj,Quad PF,6+ Mos 08/03/2018, 02/14/2021, 12/11/2022   PFIZER(Purple Top)SARS-COV-2 Vaccination  07/14/2020, 08/07/2020, 03/14/2021   PPD Test 12/11/2022   Tdap 07/23/2012, 12/11/2022   Zoster Recombinant(Shingrix) 01/19/2020, 07/14/2020    Health Maintenance  Topic Date Due   HIV Screening  Never done   Colonoscopy  Never done   MAMMOGRAM  01/19/2022   COVID-19 Vaccine (4 - 2024-25 season) 07/20/2023   INFLUENZA VACCINE  02/16/2024 (Originally 06/19/2023)   Cervical Cancer Screening (HPV/Pap Cotest)  02/14/2026   DTaP/Tdap/Td (3 - Td or Tdap) 12/11/2032   Hepatitis C Screening  Completed   Zoster Vaccines- Shingrix  Completed   HPV VACCINES  Aged Out    Discussed health benefits of physical activity, and encouraged her to engage in regular exercise appropriate for her age and condition.  Marland KitchenLynden Ang was seen today for annual exam.  Diagnoses and all orders for this visit:  Routine physical examination -     Lipid panel -     CBC w/Diff/Platelet -     CMP14+EGFR -     TSH  Slow transit constipation -     linaclotide (LINZESS) 145 MCG CAPS capsule; Take 1 capsule (145 mcg total) by mouth daily before breakfast.  Essential hypertension, benign -     valsartan-hydrochlorothiazide (DIOVAN-HCT) 160-25 MG tablet; Take 1 tablet by mouth daily. -     amLODipine (NORVASC) 2.5 MG tablet; Take 1 tablet (2.5 mg total) by mouth daily. -     tirzepatide (ZEPBOUND) 2.5 MG/0.5ML Pen; Inject 2.5 mg into the skin once a week.  Class 2 severe obesity due to excess calories with serious comorbidity and body mass index (BMI) of 35.0  to 35.9 in adult (HCC) -     tirzepatide (ZEPBOUND) 2.5 MG/0.5ML Pen; Inject 2.5 mg into the skin once a week.  Colon cancer screening -     Cologuard  Chronic pain of both knees -     diclofenac Sodium (VOLTAREN) 1 % GEL; Apply 4 g topically 4 (four) times daily. To affected joint.   .. Discussed 150 minutes of exercise a week.  Encouraged vitamin D 1000 units and Calcium 1300mg  or 4 servings of dairy a day.  Fasting labs ordered today PHQ no concerns BP not to goal: changed medication to diovan/HCT and norvasc 2.5mg   Not a candidate for phentermine right now Zepbound to start Discussed side effects and titration Cologuard ordered Mammogram scheduled Declined covid vaccine Linzess refilled for constipation Voltaren gel for chronic knee pain discussed arthritis and weight loss, tumeric Follow up in 4 weeks  Return in about 4 weeks (around 03/10/2024) for BP and weight.     Tandy Gaw, PA-C

## 2024-02-11 NOTE — Patient Instructions (Addendum)
 OTC 5000 units a day PURE   For arthritis:  Turmeric 500mg  twice a day Glucosamine Chrondrotin  Norvasc and Diovan/HCT  Health Maintenance, Female Adopting a healthy lifestyle and getting preventive care are important in promoting health and wellness. Ask your health care provider about: The right schedule for you to have regular tests and exams. Things you can do on your own to prevent diseases and keep yourself healthy. What should I know about diet, weight, and exercise? Eat a healthy diet  Eat a diet that includes plenty of vegetables, fruits, low-fat dairy products, and lean protein. Do not eat a lot of foods that are high in solid fats, added sugars, or sodium. Maintain a healthy weight Body mass index (BMI) is used to identify weight problems. It estimates body fat based on height and weight. Your health care provider can help determine your BMI and help you achieve or maintain a healthy weight. Get regular exercise Get regular exercise. This is one of the most important things you can do for your health. Most adults should: Exercise for at least 150 minutes each week. The exercise should increase your heart rate and make you sweat (moderate-intensity exercise). Do strengthening exercises at least twice a week. This is in addition to the moderate-intensity exercise. Spend less time sitting. Even light physical activity can be beneficial. Watch cholesterol and blood lipids Have your blood tested for lipids and cholesterol at 56 years of age, then have this test every 5 years. Have your cholesterol levels checked more often if: Your lipid or cholesterol levels are high. You are older than 57 years of age. You are at high risk for heart disease. What should I know about cancer screening? Depending on your health history and family history, you may need to have cancer screening at various ages. This may include screening for: Breast cancer. Cervical cancer. Colorectal  cancer. Skin cancer. Lung cancer. What should I know about heart disease, diabetes, and high blood pressure? Blood pressure and heart disease High blood pressure causes heart disease and increases the risk of stroke. This is more likely to develop in people who have high blood pressure readings or are overweight. Have your blood pressure checked: Every 3-5 years if you are 67-53 years of age. Every year if you are 6 years old or older. Diabetes Have regular diabetes screenings. This checks your fasting blood sugar level. Have the screening done: Once every three years after age 28 if you are at a normal weight and have a low risk for diabetes. More often and at a younger age if you are overweight or have a high risk for diabetes. What should I know about preventing infection? Hepatitis B If you have a higher risk for hepatitis B, you should be screened for this virus. Talk with your health care provider to find out if you are at risk for hepatitis B infection. Hepatitis C Testing is recommended for: Everyone born from [birth year] through 1963. Anyone with known risk factors for hepatitis C. Sexually transmitted infections (STIs) Get screened for STIs, including gonorrhea and chlamydia, if: You are sexually active and are younger than 56 years of age. You are older than 56 years of age and your health care provider tells you that you are at risk for this type of infection. Your sexual activity has changed since you were last screened, and you are at increased risk for chlamydia or gonorrhea. Ask your health care provider if you are at risk. Ask your health care  provider about whether you are at high risk for HIV. Your health care provider may recommend a prescription medicine to help prevent HIV infection. If you choose to take medicine to prevent HIV, you should first get tested for HIV. You should then be tested every 3 months for as long as you are taking the medicine. Pregnancy If you are  about to stop having your period (premenopausal) and you may become pregnant, seek counseling before you get pregnant. Take 400 to 800 micrograms (mcg) of folic acid every day if you become pregnant. Ask for birth control (contraception) if you want to prevent pregnancy. Osteoporosis and menopause Osteoporosis is a disease in which the bones lose minerals and strength with aging. This can result in bone fractures. If you are 80 years old or older, or if you are at risk for osteoporosis and fractures, ask your health care provider if you should: Be screened for bone loss. Take a calcium or vitamin D supplement to lower your risk of fractures. Be given hormone replacement therapy (HRT) to treat symptoms of menopause. Follow these instructions at home: Alcohol use Do not drink alcohol if: Your health care provider tells you not to drink. You are pregnant, may be pregnant, or are planning to become pregnant. If you drink alcohol: Limit how much you have to: 0-1 drink a day. Know how much alcohol is in your drink. In the [place] one drink equals one 12 oz bottle of beer (355 mL), one 5 oz glass of wine (148 mL), or one 1 oz glass of hard liquor (44 mL). Lifestyle Do not use any products that contain nicotine or tobacco. These products include cigarettes, chewing tobacco, and vaping devices, such as e-cigarettes. If you need help quitting, ask your health care provider. Do not use street drugs. Do not share needles. Ask your health care provider for help if you need support or information about quitting drugs. General instructions Schedule regular health, dental, and eye exams. Stay current with your vaccines. Tell your health care provider if: You often feel depressed. You have ever been abused or do not feel safe at home. Summary Adopting a healthy lifestyle and getting preventive care are important in promoting health and wellness. Follow your health care provider's instructions about  healthy diet, exercising, and getting tested or screened for diseases. Follow your health care provider's instructions on monitoring your cholesterol and blood pressure. This information is not intended to replace advice given to you by your health care provider. Make sure you discuss any questions you have with your health care provider. Document Revised: 03/26/2021 Document Reviewed: 03/26/2021 Elsevier Patient Education  2024 [employer].

## 2024-02-12 ENCOUNTER — Other Ambulatory Visit: Payer: Self-pay | Admitting: Physician Assistant

## 2024-02-12 ENCOUNTER — Encounter: Payer: Self-pay | Admitting: Physician Assistant

## 2024-02-12 DIAGNOSIS — E876 Hypokalemia: Secondary | ICD-10-CM

## 2024-02-12 DIAGNOSIS — E782 Mixed hyperlipidemia: Secondary | ICD-10-CM

## 2024-02-12 LAB — CMP14+EGFR
ALT: 17 IU/L (ref 0–32)
AST: 18 IU/L (ref 0–40)
Albumin: 3.8 g/dL (ref 3.8–4.9)
Alkaline Phosphatase: 121 IU/L (ref 44–121)
BUN/Creatinine Ratio: 20 (ref 9–23)
BUN: 18 mg/dL (ref 6–24)
Bilirubin Total: 0.4 mg/dL (ref 0.0–1.2)
CO2: 30 mmol/L — ABNORMAL HIGH (ref 20–29)
Calcium: 9 mg/dL (ref 8.7–10.2)
Chloride: 97 mmol/L (ref 96–106)
Creatinine, Ser: 0.92 mg/dL (ref 0.57–1.00)
Globulin, Total: 2.9 g/dL (ref 1.5–4.5)
Glucose: 84 mg/dL (ref 70–99)
Potassium: 2.8 mmol/L — ABNORMAL LOW (ref 3.5–5.2)
Sodium: 140 mmol/L (ref 134–144)
Total Protein: 6.7 g/dL (ref 6.0–8.5)
eGFR: 73 mL/min/{1.73_m2} (ref >59)

## 2024-02-12 LAB — TSH: TSH: 2.58 u[IU]/mL (ref 0.450–4.500)

## 2024-02-12 LAB — CBC WITH DIFFERENTIAL/PLATELET
Basophils Absolute: 0 10*3/uL (ref 0.0–0.2)
Basos: 0 %
EOS (ABSOLUTE): 0.1 10*3/uL (ref 0.0–0.4)
Eos: 1 %
Hematocrit: 36.8 % (ref 34.0–46.6)
Hemoglobin: 12 g/dL (ref 11.1–15.9)
Immature Grans (Abs): 0 10*3/uL (ref 0.0–0.1)
Immature Granulocytes: 0 %
Lymphocytes Absolute: 3.2 10*3/uL — ABNORMAL HIGH (ref 0.7–3.1)
Lymphs: 31 %
MCH: 27 pg (ref 26.6–33.0)
MCHC: 32.6 g/dL (ref 31.5–35.7)
MCV: 83 fL (ref 79–97)
Monocytes Absolute: 0.6 10*3/uL (ref 0.1–0.9)
Monocytes: 6 %
Neutrophils Absolute: 6.4 10*3/uL (ref 1.4–7.0)
Neutrophils: 62 %
Platelets: 367 10*3/uL (ref 150–450)
RBC: 4.44 x10E6/uL (ref 3.77–5.28)
RDW: 14 % (ref 11.7–15.4)
WBC: 10.4 10*3/uL (ref 3.4–10.8)

## 2024-02-12 LAB — LIPID PANEL
Chol/HDL Ratio: 3.2 ratio (ref 0.0–4.4)
Cholesterol, Total: 235 mg/dL — ABNORMAL HIGH (ref 100–199)
HDL: 73 mg/dL (ref >39)
LDL Chol Calc (NIH): 135 mg/dL — ABNORMAL HIGH (ref 0–99)
Triglycerides: 155 mg/dL — ABNORMAL HIGH (ref 0–149)
VLDL Cholesterol Cal: 27 mg/dL (ref 5–40)

## 2024-02-12 MED ORDER — ATORVASTATIN CALCIUM 40 MG PO TABS
40.0000 mg | ORAL_TABLET | Freq: Every day | ORAL | 3 refills | Status: AC
Start: 2024-02-12 — End: ?

## 2024-02-12 MED ORDER — POTASSIUM CHLORIDE CRYS ER 10 MEQ PO TBCR: 10.0000 meq | EXTENDED_RELEASE_TABLET | Freq: Every day | ORAL | 0 refills | Status: DC

## 2024-02-12 NOTE — Progress Notes (Signed)
 Jasdeep,   LDL, bad cholesterol, no to optimal goal. You need to take lipitor daily.   Potassium is low. Are you taking the potassium daily? Let me know because we need to make adjustment and then recheck in 1 week.   Thyroid, kidney, liver look good.

## 2024-02-13 ENCOUNTER — Other Ambulatory Visit (HOSPITAL_COMMUNITY): Payer: Self-pay

## 2024-02-13 ENCOUNTER — Telehealth: Payer: Self-pay | Admitting: Pharmacy Technician

## 2024-02-13 NOTE — Telephone Encounter (Signed)
 Pharmacy Patient Advocate Encounter   Received notification from Onbase that prior authorization for Zepbound 2.5MG /0.5ML pen-injectors is required/requested.   Insurance verification completed.   The patient is insured through [facility] .   Per test claim: PA required; PA submitted to above mentioned insurance via CoverMyMeds Key/confirmation #/EOC [id number] Status is pending

## 2024-02-13 NOTE — Telephone Encounter (Signed)
 Attempted call to patient. Left a voice mail message requesting a return call=kph

## 2024-02-13 NOTE — Telephone Encounter (Signed)
 Pharmacy Patient Advocate Encounter  Received notification from [facility] that Prior Authorization for Zepbound 2.5MG /0.5ML pen-injectors has been DENIED.  Full denial letter will be uploaded to the media tab. See denial reason below.   PA #/Case ID/Reference #: [id number]

## 2024-02-16 ENCOUNTER — Other Ambulatory Visit (HOSPITAL_COMMUNITY): Payer: Self-pay

## 2024-02-16 ENCOUNTER — Encounter: Payer: Self-pay | Admitting: Physician Assistant

## 2024-02-16 ENCOUNTER — Telehealth: Payer: Self-pay

## 2024-02-16 DIAGNOSIS — G8929 Other chronic pain: Secondary | ICD-10-CM | POA: Insufficient documentation

## 2024-02-16 MED ORDER — POTASSIUM CHLORIDE CRYS ER 10 MEQ PO TBCR: 10.0000 meq | EXTENDED_RELEASE_TABLET | Freq: Every day | ORAL | 0 refills | Status: AC

## 2024-02-16 NOTE — Telephone Encounter (Signed)
 Copied from CRM [id number]. Topic: General - Other >> Feb 16, 2024 10:56 AM Philippa Chester F wrote: Reason for CRM:   Patient is looking to get an update on a form brought over to the office to have filled out for her physical. Patient stated the form is due today 02/16/2024. Agent called over to [employer] for further assistance; office stated they will contact the patient once they have an update.   Callback Number: [phone]

## 2024-02-16 NOTE — Telephone Encounter (Signed)
 Attempted call to patient. Left a voice mail message.

## 2024-02-16 NOTE — Telephone Encounter (Signed)
 Pharmacy Patient Advocate Encounter  Received notification from [facility] that Prior Authorization for Linzess 145 has been DENIED.  Full denial letter will be uploaded to the media tab. See denial reason below.   PA #/Case ID/Reference #: [id number]

## 2024-02-17 NOTE — Telephone Encounter (Signed)
 PA request has been Denied. New Encounter has been or will be created for follow up. For additional info see Pharmacy Prior Auth telephone encounter from 02/11/2024.

## 2024-02-17 NOTE — Telephone Encounter (Signed)
 Spoke with pharmacy   Linzess needing new insurance PA  [facility] [phone] Patient also informed that Linzess is in need of PA and that I am forwarding to  PA department to start PA for her.

## 2024-02-19 NOTE — Telephone Encounter (Signed)
Attempted call to patient . Phone rang without answer. Could not leave a voice mail message.

## 2024-02-19 NOTE — Telephone Encounter (Signed)
 Letter placed in mail to patient

## 2024-02-26 ENCOUNTER — Other Ambulatory Visit (HOSPITAL_COMMUNITY): Payer: Self-pay

## 2024-03-03 ENCOUNTER — Telehealth: Payer: Self-pay

## 2024-03-03 ENCOUNTER — Other Ambulatory Visit (HOSPITAL_COMMUNITY): Payer: Self-pay

## 2024-03-03 NOTE — Telephone Encounter (Signed)
 AP2JPharmacy Patient Advocate Encounter   Received notification from CoverMyMeds that prior authorization for Linzess 145 is required/requested.   Insurance verification completed.   The patient is insured through [facility] .   Per test claim: PA required; PA submitted to above mentioned insurance via CoverMyMeds Key/confirmation #/EOC [id number] Status is pending

## 2024-03-04 ENCOUNTER — Other Ambulatory Visit (HOSPITAL_COMMUNITY): Payer: Self-pay

## 2024-03-04 ENCOUNTER — Ambulatory Visit

## 2024-03-04 ENCOUNTER — Ambulatory Visit: Admitting: Physician Assistant

## 2024-03-04 VITALS — BP 120/70 | HR 80

## 2024-03-04 DIAGNOSIS — I10 Essential (primary) hypertension: Secondary | ICD-10-CM

## 2024-03-04 NOTE — Progress Notes (Signed)
 Pt  here is here blood pressure check , pt denies trouble sleeping , palpitations or medication changes  pt reports  she taking amlodipine 2.5mg .Pt is to return to clinic for a  fup with pcp in 3/4 weeks.

## 2024-03-04 NOTE — Telephone Encounter (Signed)
 Pharmacy Patient Advocate Encounter  Received notification from [employer] that Prior Authorization for Linzess 145 has been APPROVED from 03/03/24 to 03/03/25. Ran test claim, Copay is $35.00 for a 90 day supply. This test claim was processed through [facility]- copay amounts may vary at other pharmacies due to pharmacy/plan contracts, or as the patient moves through the different stages of their insurance plan.   PA #/Case ID/Reference #: [id number]

## 2024-03-08 MED ORDER — VALSARTAN-HYDROCHLOROTHIAZIDE 160-25 MG PO TABS: 1.0000 | ORAL_TABLET | Freq: Every day | ORAL | 1 refills | Status: AC

## 2024-03-08 MED ORDER — AMLODIPINE BESYLATE 2.5 MG PO TABS: 2.5000 mg | ORAL_TABLET | Freq: Every day | ORAL | 1 refills | Status: AC

## 2024-03-08 NOTE — Progress Notes (Signed)
 Sent refills of BP medications. BP to goal.

## 2024-03-16 ENCOUNTER — Ambulatory Visit: Admitting: Physician Assistant

## 2024-03-17 ENCOUNTER — Ambulatory Visit

## 2024-03-17 DIAGNOSIS — Z1231 Encounter for screening mammogram for malignant neoplasm of breast: Secondary | ICD-10-CM

## 2024-03-22 ENCOUNTER — Encounter: Payer: Self-pay | Admitting: Physician Assistant

## 2024-03-22 NOTE — Progress Notes (Signed)
 Normal mammogram. Follow up in 1 year.

## 2024-03-24 ENCOUNTER — Ambulatory Visit: Admitting: Physician Assistant

## 2024-03-31 ENCOUNTER — Ambulatory Visit (INDEPENDENT_AMBULATORY_CARE_PROVIDER_SITE_OTHER): Admitting: Physician Assistant

## 2024-03-31 ENCOUNTER — Encounter: Payer: Self-pay | Admitting: Physician Assistant

## 2024-03-31 VITALS — BP 137/82 | HR 78 | Ht 65.0 in | Wt 219.0 lb

## 2024-03-31 DIAGNOSIS — R4589 Other symptoms and signs involving emotional state: Secondary | ICD-10-CM

## 2024-03-31 DIAGNOSIS — E6609 Other obesity due to excess calories: Secondary | ICD-10-CM

## 2024-03-31 DIAGNOSIS — E782 Mixed hyperlipidemia: Secondary | ICD-10-CM | POA: Diagnosis not present

## 2024-03-31 DIAGNOSIS — E66812 Other obesity due to excess calories: Secondary | ICD-10-CM

## 2024-03-31 DIAGNOSIS — I10 Essential (primary) hypertension: Secondary | ICD-10-CM

## 2024-03-31 DIAGNOSIS — E876 Hypokalemia: Secondary | ICD-10-CM | POA: Diagnosis not present

## 2024-03-31 DIAGNOSIS — Z6836 Body mass index (BMI) 36.0-36.9, adult: Secondary | ICD-10-CM

## 2024-03-31 MED ORDER — TIRZEPATIDE 10 MG/0.5ML ~~LOC~~ SOAJ: SUBCUTANEOUS | Status: AC

## 2024-03-31 MED ORDER — BUPROPION HCL ER (XL) 150 MG PO TB24: 150.0000 mg | ORAL_TABLET | Freq: Every day | ORAL | 0 refills | Status: DC

## 2024-04-02 ENCOUNTER — Encounter: Payer: Self-pay | Admitting: Physician Assistant

## 2024-04-02 NOTE — Progress Notes (Signed)
 Established Patient Office Visit  Subjective   Patient ID: Tammy Reed, female    DOB: 12-Nov-1966  Age: 58 y.o. MRN: [id number]  No chief complaint on file.   HPI Pt is a 58 yo obese female who presents to the clinic to follow up on weight. Pt has pmh of HTN, HLD, knee pain.   Pt is not checking BP at home. She is being compliant with medication.   She ordered tirezepitide online and thinks she could have been scammed. She cannot get solution out.  She is wanting to go through compounding. Pt is walking but no regular exercise. She is very fatigued and just over all "down mood". She would like something "to bring her up".   Patient Active Problem List   Diagnosis Date Noted   Chronic pain of both knees 02/16/2024   Nasal congestion 11/14/2023   ETD (Eustachian tube dysfunction), bilateral 11/14/2023   Newly recognized heart murmur 06/30/2023   Chronic venous stasis 06/30/2023   Long QT interval 10/22/2021   Class 2 severe obesity due to excess calories with serious comorbidity and body mass index (BMI) of 35.0 to 35.9 in adult [facility]) 03/28/2021   Pilar cyst 01/21/2020   Hypokalemia 01/21/2020   Bilateral impacted cerumen 01/19/2020   Hyperlipidemia 08/04/2018   Morning joint stiffness of hand 12/17/2017   Bilateral hand pain 12/17/2017   Bilateral carpal tunnel syndrome 03/28/2016   Pilar and trichilemmal cysts 01/08/2016   Chest tightness 01/08/2016   Migraine without aura and with status migrainosus, not intractable 01/08/2016   Atypical chest pain 01/08/2016   Class 2 obesity due to excess calories without serious comorbidity with body mass index (BMI) of 36.0 to 36.9 in adult 08/14/2015   Stress at work 08/14/2015   Dental caries 06/14/2014   Slow transit constipation 04/08/2014   Abnormal weight gain 04/08/2014   Essential hypertension, benign 03/03/2013   Migraine with aura 10/21/2012   Obesity (BMI 30-39.9) 10/15/2011   Past Medical History:  Diagnosis Date    Hypertension    Family Status  Relation Name Status   Mother  Alive   Sister Stana Ear Alive   Brother Myrtie Atkinson Alive   Brother Samuel Crock Alive   Sister Cornelius Dill Alive  No partnership data on file   Family History  Problem Relation Age of Onset   Heart disease Mother    Depression Brother    Allergies  Allergen Reactions   Losartan      Nausea/vision changes.       Review of Systems  All other systems reviewed and are negative.     Objective:     BP 137/82   Pulse 78   Ht 5\' 5"  (1.651 m)   Wt 219 lb (99.3 kg)   SpO2 99%   BMI 36.44 kg/m  BP Readings from Last 3 Encounters:  03/31/24 137/82  03/04/24 120/70  02/11/24 (!) 152/68   Wt Readings from Last 3 Encounters:  03/31/24 219 lb (99.3 kg)  02/11/24 216 lb (98 kg)  11/14/23 222 lb (100.7 kg)      Physical Exam Constitutional:      Appearance: Normal appearance. She is obese.  HENT:     Head: Normocephalic.  Cardiovascular:     Rate and Rhythm: Normal rate and regular rhythm.  Pulmonary:     Effort: Pulmonary effort is normal.     Breath sounds: Normal breath sounds.  Neurological:     General: No focal deficit present.     Mental  Status: She is alert and oriented to person, place, and time.  Psychiatric:        Mood and Affect: Mood normal.       The 10-year ASCVD risk score (Arnett DK, et al., 2019) is: 6%    Assessment & Plan:  Aaron AasAaron AasDiagnoses and all orders for this visit:  Class 2 obesity due to excess calories without serious comorbidity with body mass index (BMI) of 36.0 to 36.9 in adult -     tirzepatide  (MOUNJARO) 10 MG/0.5ML Pen; Liposlim.  Tirzepatide /Pyridoxine/Thiamine/L-Carnitine 10mg /mL.  Inject 2.5 mg/25 units subcu weekly for 4 weeks then 5 mg/50 units subcu weekly for 4 weeks then 7.5 mg/75 units subcu weekly for 4 weeks then 10 mg/100 units subcu weekly for 4 weeks then 15 mg/150 units subcu weekly -     buPROPion  (WELLBUTRIN  XL) 150 MG 24 hr tablet; Take 1 tablet (150 mg total) by mouth  daily.  Hypokalemia -     Potassium  Essential hypertension, benign -     tirzepatide  (MOUNJARO) 10 MG/0.5ML Pen; Liposlim.  Tirzepatide /Pyridoxine/Thiamine/L-Carnitine 10mg /mL.  Inject 2.5 mg/25 units subcu weekly for 4 weeks then 5 mg/50 units subcu weekly for 4 weeks then 7.5 mg/75 units subcu weekly for 4 weeks then 10 mg/100 units subcu weekly for 4 weeks then 15 mg/150 units subcu weekly  Mixed hyperlipidemia -     tirzepatide  (MOUNJARO) 10 MG/0.5ML Pen; Liposlim.  Tirzepatide /Pyridoxine/Thiamine/L-Carnitine 10mg /mL.  Inject 2.5 mg/25 units subcu weekly for 4 weeks then 5 mg/50 units subcu weekly for 4 weeks then 7.5 mg/75 units subcu weekly for 4 weeks then 10 mg/100 units subcu weekly for 4 weeks then 15 mg/150 units subcu weekly  Depressed mood -     buPROPion  (WELLBUTRIN  XL) 150 MG 24 hr tablet; Take 1 tablet (150 mg total) by mouth daily.   Aaron Aas.Discussed low carb diet with 1500 calories and 80g of protein.  Exercising at least 150 minutes a week.  [employer] could be a Chief Technology Officer.  Discussed medication options Sent liposlim to [facility] to titrate up from 25 units to 100 units weekly Discussed risk vs benefits and side effects Start wellbutrin  in the morning for mood and could help with appetite Repeat potassium today BP under 140/90, continue on same medications.  Follow up in 3 months    Return in about 3 months (around 07/01/2024).    Nikole Swartzentruber, PA-C

## 2024-04-13 ENCOUNTER — Emergency Department (HOSPITAL_BASED_OUTPATIENT_CLINIC_OR_DEPARTMENT_OTHER)

## 2024-04-13 ENCOUNTER — Emergency Department (HOSPITAL_BASED_OUTPATIENT_CLINIC_OR_DEPARTMENT_OTHER)
Admission: EM | Admit: 2024-04-13 | Discharge: 2024-04-13 | Disposition: A | Discharge status: Home / Self Care | Attending: Emergency Medicine | Admitting: Emergency Medicine

## 2024-04-13 DIAGNOSIS — R519 Headache, unspecified: Secondary | ICD-10-CM | POA: Diagnosis present

## 2024-04-13 DIAGNOSIS — Z79899 Other long term (current) drug therapy: Secondary | ICD-10-CM | POA: Diagnosis not present

## 2024-04-13 DIAGNOSIS — I10 Essential (primary) hypertension: Secondary | ICD-10-CM | POA: Diagnosis not present

## 2024-04-13 NOTE — ED Triage Notes (Signed)
 C/o headache x 1 week. Worse today. No focal deficits. No hx of migraines. Hx of HTN.

## 2024-04-13 NOTE — Discharge Instructions (Signed)
 Continue Tylenol  1000 mg every 6 hours as needed for headache.  Continue 400 mg ibuprofen every 8 hours as needed for headache.  Follow-up with your primary care doctor.  Return if symptoms worsen.

## 2024-04-13 NOTE — ED Notes (Signed)
 RN reviewed discharge instructions with pt. Pt verbalized understanding and had no further questions. VSS upon discharge.

## 2024-04-13 NOTE — ED Provider Notes (Signed)
 Tammy Reed EMERGENCY DEPARTMENT AT [facility] Provider Note   CSN: [id number] Arrival date & time: 04/13/24  1623     History  Chief Complaint  Patient presents with   Headache    Tammy Reed is a 58 y.o. female.  Patient here with intermittent headaches for the last week.  Headache now resolved.  No history of migraines.  History of high blood pressure.  Denies any weakness numbness tingling.  Denies any fever or chills.  No neck stiffness.  Denies any vision loss speech changes stroke symptoms.  She was just worried because headaches were new she has been having them daily but has not been sleeping very well recently.  The history is provided by the patient.       Home Medications Prior to Admission medications   Medication Sig Start Date End Date Taking? Authorizing Provider  amLODipine  (NORVASC ) 2.5 MG tablet Take 1 tablet (2.5 mg total) by mouth daily. 03/08/24   Reed, Tammy L, PA-C  atorvastatin  (LIPITOR) 40 MG tablet Take 1 tablet (40 mg total) by mouth daily. For cholesterol. 02/12/24   Reed, Tammy L, PA-C  buPROPion  (WELLBUTRIN  XL) 150 MG 24 hr tablet Take 1 tablet (150 mg total) by mouth daily. 03/31/24   Reed, Tammy L, PA-C  diclofenac  Sodium (VOLTAREN ) 1 % GEL Apply 4 g topically 4 (four) times daily. To affected joint. 02/11/24   Reed, Tammy L, PA-C  fluticasone  (FLONASE ) 50 MCG/ACT nasal spray Place 2 sprays into both nostrils daily. 11/14/23   Reed, Tammy L, PA-C  linaclotide  (LINZESS ) 145 MCG CAPS capsule Take 1 capsule (145 mcg total) by mouth daily before breakfast. 02/11/24   Reed, Tammy L, PA-C  potassium chloride  (KLOR-CON  M) 10 MEQ tablet Take 1 tablet (10 mEq total) by mouth daily. 02/16/24   Reed, Tammy L, PA-C  tirzepatide  (MOUNJARO) 10 MG/0.5ML Pen Liposlim.  Tirzepatide /Pyridoxine/Thiamine/L-Carnitine 10mg /mL.  Inject 2.5 mg/25 units subcu weekly for 4 weeks then 5 mg/50 units subcu weekly for 4 weeks then 7.5 mg/75 units subcu  weekly for 4 weeks then 10 mg/100 units subcu weekly for 4 weeks then 15 mg/150 units subcu weekly 03/31/24   Reed, Tammy L, PA-C  valsartan -hydrochlorothiazide  (DIOVAN -HCT) 160-25 MG tablet Take 1 tablet by mouth daily. 03/08/24   Reed, Tammy L, PA-C      Allergies    Losartan     Review of Systems   Review of Systems  Physical Exam Updated Vital Signs BP (!) 164/99 (BP Location: Left Arm)   Pulse 72   Temp 97.9 F (36.6 C) (Oral)   Resp 18   SpO2 99%  Physical Exam Vitals and nursing note reviewed.  Constitutional:      General: She is not in acute distress.    Appearance: She is well-developed.  HENT:     Head: Normocephalic and atraumatic.     Mouth/Throat:     Mouth: Mucous membranes are moist.  Eyes:     General: No visual field deficit.    Extraocular Movements: Extraocular movements intact.     Right eye: Normal extraocular motion and no nystagmus.     Left eye: Normal extraocular motion and no nystagmus.     Conjunctiva/sclera: Conjunctivae normal.     Pupils: Pupils are equal, round, and reactive to light.     Right eye: Pupil is reactive.     Left eye: Pupil is reactive.  Neck:     Meningeal: Brudzinski's sign and Kernig's sign absent.  Cardiovascular:  Rate and Rhythm: Normal rate and regular rhythm.     Heart sounds: Normal heart sounds. No murmur heard. Pulmonary:     Effort: Pulmonary effort is normal. No respiratory distress.     Breath sounds: Normal breath sounds.  Abdominal:     Palpations: Abdomen is soft.     Tenderness: There is no abdominal tenderness.  Musculoskeletal:        General: No swelling.     Cervical back: Normal range of motion and neck supple. No rigidity.  Lymphadenopathy:     Cervical: No cervical adenopathy.  Skin:    General: Skin is warm and dry.     Capillary Refill: Capillary refill takes less than 2 seconds.  Neurological:     Mental Status: She is alert and oriented to person, place, and time.     Cranial  Nerves: No cranial nerve deficit, dysarthria or facial asymmetry.     Sensory: No sensory deficit.     Motor: No weakness.     Coordination: Romberg sign negative. Coordination normal.     Comments: Normal strength and sensation throughout, normal visual fields, normal speech no drift normal coordination  Psychiatric:        Mood and Affect: Mood normal.     ED Results / Procedures / Treatments   Labs (all labs ordered are listed, but only abnormal results are displayed) Labs Reviewed - No data to display  EKG None  Radiology CT Head Wo Contrast Result Date: 04/13/2024 CLINICAL DATA:  Headache, increasing frequency or severity EXAM: CT HEAD WITHOUT CONTRAST TECHNIQUE: Contiguous axial images were obtained from the base of the skull through the vertex without intravenous contrast. RADIATION DOSE REDUCTION: This exam was performed according to the departmental dose-optimization program which includes automated exposure control, adjustment of the mA and/or kV according to patient size and/or use of iterative reconstruction technique. COMPARISON:  None Available. FINDINGS: Brain: No evidence of acute infarction, hemorrhage, hydrocephalus, extra-axial collection or mass lesion/mass effect. Vascular: No hyperdense vessel or unexpected calcification. Skull: Normal. Negative for fracture or focal lesion. Subcutaneous calcified nodule right posterior parietal region is consistent with an epidermoid inclusion cyst. Sinuses/Orbits: No acute finding. IMPRESSION: No acute intracranial process. Electronically Signed   By: Tammy Reed M.D.   On: 04/13/2024 18:12    Procedures Procedures    Medications Ordered in ED Medications - No data to display  ED Course/ Medical Decision Making/ A&P                                 Medical Decision Making Amount and/or Complexity of Data Reviewed Radiology: ordered.   Tammy Reed is here with headache.  History of hypertension.  Unremarkable vitals.   Mildly elevated blood pressure.  She has been having headaches now for the last week but increase stress poor sleep she states.  Headaches are new for her was concerned about them.  Is not having any symptoms now.  She is got normal neurological exam.  Differential diagnosis likely migraine tension related headaches.  Seems less likely to be head bleed brain mass.  I have no concern for stroke.  CT scan of the head was obtained that was unremarkable per radiology report.  Overall she is asymptomatic here.  I do think that her headaches could be stress/migraine related.  Recommend Tylenol  and ibuprofen.  She has follow-up next week with primary care doctor.  Continue to monitor for  headaches.  If symptoms worsen please return for evaluation.  Recommend focusing on sleep.  Understands to return if symptoms worsen discharged in good condition.  This chart was dictated using voice recognition software.  Despite best efforts to proofread,  errors can occur which can change the documentation meaning.         Final Clinical Impression(s) / ED Diagnoses Final diagnoses:  Nonintractable headache, unspecified chronicity pattern, unspecified headache type    Rx / DC Orders ED Discharge Orders     None         Tammy Rue, DO 04/13/24 1940

## 2024-04-20 ENCOUNTER — Ambulatory Visit: Admitting: Physician Assistant

## 2024-04-23 ENCOUNTER — Inpatient Hospital Stay: Admitting: Physician Assistant

## 2024-04-28 ENCOUNTER — Inpatient Hospital Stay: Admitting: Physician Assistant

## 2024-06-25 ENCOUNTER — Other Ambulatory Visit: Payer: Self-pay | Admitting: Physician Assistant

## 2024-06-25 DIAGNOSIS — Z1231 Encounter for screening mammogram for malignant neoplasm of breast: Secondary | ICD-10-CM

## 2024-06-25 DIAGNOSIS — I10 Essential (primary) hypertension: Secondary | ICD-10-CM

## 2024-06-25 DIAGNOSIS — R011 Cardiac murmur, unspecified: Secondary | ICD-10-CM

## 2024-06-25 DIAGNOSIS — I878 Other specified disorders of veins: Secondary | ICD-10-CM

## 2024-06-28 ENCOUNTER — Other Ambulatory Visit: Payer: Self-pay | Admitting: Physician Assistant

## 2024-06-28 DIAGNOSIS — R4589 Other symptoms and signs involving emotional state: Secondary | ICD-10-CM

## 2024-06-28 DIAGNOSIS — E66812 Obesity, class 2: Secondary | ICD-10-CM

## 2024-06-28 DIAGNOSIS — E6609 Other obesity due to excess calories: Secondary | ICD-10-CM

## 2024-08-20 ENCOUNTER — Ambulatory Visit (INDEPENDENT_AMBULATORY_CARE_PROVIDER_SITE_OTHER): Admitting: Physician Assistant

## 2024-08-20 ENCOUNTER — Encounter: Payer: Self-pay | Admitting: Physician Assistant

## 2024-08-20 VITALS — BP 172/84 | HR 48 | Ht 65.0 in | Wt 207.0 lb

## 2024-08-20 DIAGNOSIS — J0100 Acute maxillary sinusitis, unspecified: Secondary | ICD-10-CM | POA: Diagnosis not present

## 2024-08-20 DIAGNOSIS — R300 Dysuria: Secondary | ICD-10-CM

## 2024-08-20 DIAGNOSIS — N3001 Acute cystitis with hematuria: Secondary | ICD-10-CM

## 2024-08-20 LAB — POCT URINALYSIS DIP (CLINITEK)
Bilirubin, UA: NEGATIVE
Glucose, UA: NEGATIVE mg/dL
Ketones, POC UA: NEGATIVE mg/dL
Nitrite, UA: NEGATIVE
Spec Grav, UA: 1.015 (ref 1.010–1.025)
Urobilinogen, UA: 0.2 U/dL
pH, UA: 7 (ref 5.0–8.0)

## 2024-08-20 MED ORDER — FLUCONAZOLE 150 MG PO TABS: 150.0000 mg | ORAL_TABLET | Freq: Once | ORAL | 0 refills | Status: AC

## 2024-08-20 MED ORDER — SULFAMETHOXAZOLE-TRIMETHOPRIM 800-160 MG PO TABS: 1.0000 | ORAL_TABLET | Freq: Two times a day (BID) | ORAL | 0 refills | Status: DC

## 2024-08-20 NOTE — Patient Instructions (Signed)

## 2024-08-20 NOTE — Progress Notes (Signed)
 Acute Office Visit  Subjective:     Patient ID: Tammy Reed, female    DOB: 04-01-1966, 58 y.o.   MRN: [id number]   HPI Pt is a 58 yo female who presents to the clinic with dysuria for 3 days and ongoing sinus pressure, congestion, cough for over a week.  She has been taking OTC cold and cough medications and daily mucinex  with no relief. She denies any fever, chills, flank pain, nausea or vomiting. Her urinary symptoms start 3 days ago. She has not tried anything for them. She has had some white colored discharge as well with some vaginal itching.      ROS See HPI.      Objective:    BP (!) 172/84   Pulse (!) 48   Ht 5' 5 (1.651 m)   Wt 207 lb (93.9 kg)   SpO2 99%   BMI 34.45 kg/m  BP Readings from Last 3 Encounters:  08/20/24 (!) 172/84  04/13/24 (!) 160/91  03/31/24 137/82   Wt Readings from Last 3 Encounters:  08/20/24 207 lb (93.9 kg)  03/31/24 219 lb (99.3 kg)  02/11/24 216 lb (98 kg)    .SABRA Results for orders placed or performed in visit on 08/20/24  Urine Culture   Collection Time: 08/20/24  9:32 AM   Specimen: Urine   URINE  Result Value Ref Range   Urine Culture, Routine Final report    Organism ID, Bacteria Comment   POCT URINALYSIS DIP (CLINITEK)   Collection Time: 08/20/24 10:01 AM  Result Value Ref Range   Color, UA yellow yellow   Clarity, UA cloudy (A) clear   Glucose, UA negative negative mg/dL   Bilirubin, UA negative negative   Ketones, POC UA negative negative mg/dL   Spec Grav, UA 8.984 8.989 - 1.025   Blood, UA trace-intact (A) negative   pH, UA 7.0 5.0 - 8.0   POC PROTEIN,UA trace negative, trace   Urobilinogen, UA 0.2 0.2 or 1.0 E.U./dL   Nitrite, UA Negative Negative   Leukocytes, UA Small (1+) (A) Negative     Physical Exam Constitutional:      Appearance: Normal appearance. She is obese.  HENT:     Head: Normocephalic.     Comments: Tenderness to palpation over sinuses.     Right Ear: Tympanic membrane, ear canal and  external ear normal. There is no impacted cerumen.     Left Ear: Tympanic membrane, ear canal and external ear normal. There is no impacted cerumen.     Nose: Congestion present.     Mouth/Throat:     Mouth: Mucous membranes are moist.     Pharynx: Posterior oropharyngeal erythema present. No oropharyngeal exudate.  Eyes:     Conjunctiva/sclera: Conjunctivae normal.  Cardiovascular:     Rate and Rhythm: Normal rate and regular rhythm.  Pulmonary:     Effort: Pulmonary effort is normal.     Breath sounds: Normal breath sounds. No wheezing or rhonchi.  Abdominal:     General: There is no distension.     Palpations: Abdomen is soft. There is no mass.     Tenderness: There is no abdominal tenderness. There is no right CVA tenderness, left CVA tenderness, guarding or rebound.  Musculoskeletal:     Cervical back: Normal range of motion and neck supple. No tenderness.     Right lower leg: No edema.     Left lower leg: No edema.  Lymphadenopathy:     Cervical: No  cervical adenopathy.  Neurological:     General: No focal deficit present.     Mental Status: She is alert and oriented to person, place, and time.  Psychiatric:        Mood and Affect: Mood normal.         Assessment & Plan:  Tammy Reed was seen today for dysuria.  Diagnoses and all orders for this visit:  Acute cystitis with hematuria -     Urine Culture -     sulfamethoxazole-trimethoprim (BACTRIM DS) 800-160 MG tablet; Take 1 tablet by mouth 2 (two) times daily.  Dysuria -     POCT URINALYSIS DIP (CLINITEK) -     Urine Culture -     NuSwab Vaginitis Plus (VG+)  Acute non-recurrent maxillary sinusitis -     sulfamethoxazole-trimethoprim (BACTRIM DS) 800-160 MG tablet; Take 1 tablet by mouth 2 (two) times daily.  Other orders -     fluconazole (DIFLUCAN) 150 MG tablet; Take 1 tablet (150 mg total) by mouth once for 1 dose. At end of antibiotic.  UA positive for leuks, blood, protein Culture sent off Start bactrim  to treat for UTI and sinusitis Wet prep ordered to confirm if yeast or not Diflucan given to take at end of abx Symptomatic care given to drink plenty of water Follow up as needed if symptoms persist or worsen.    Bettylou Frew, PA-C

## 2024-08-22 LAB — URINE CULTURE

## 2024-08-23 ENCOUNTER — Ambulatory Visit: Payer: Self-pay | Admitting: Physician Assistant

## 2024-08-23 LAB — NUSWAB VAGINITIS PLUS (VG+)
Candida albicans, NAA: NEGATIVE
Candida glabrata, NAA: NEGATIVE
Chlamydia trachomatis, NAA: NEGATIVE
Neisseria gonorrhoeae, NAA: NEGATIVE
Trich vag by NAA: POSITIVE — AB

## 2024-08-23 MED ORDER — METRONIDAZOLE 500 MG PO TABS: 500.0000 mg | ORAL_TABLET | Freq: Two times a day (BID) | ORAL | 0 refills | Status: DC

## 2024-08-23 NOTE — Telephone Encounter (Signed)
 Patient is aware of results. Voiced her understanding also stated she wasn't sure how she would have tested positive for trich, as she has not had sex in over 11 yrs.

## 2024-08-23 NOTE — Progress Notes (Signed)
 Rare cases but some times douching too much can cause as well or contamination from toilet seat.

## 2024-08-23 NOTE — Progress Notes (Signed)
 Wet prep shows bacterial vaginosis and trichomonas. I will send metrondiazole to take with bactrim given on Friday.

## 2024-08-23 NOTE — Progress Notes (Signed)
 Low colony count of bacteria in urine culture. Finish bactrim.

## 2024-08-30 ENCOUNTER — Other Ambulatory Visit: Payer: Self-pay

## 2024-08-30 ENCOUNTER — Emergency Department (HOSPITAL_COMMUNITY)

## 2024-08-30 ENCOUNTER — Emergency Department (HOSPITAL_COMMUNITY)
Admission: EM | Admit: 2024-08-30 | Discharge: 2024-08-31 | Disposition: A | Discharge status: Home / Self Care | Attending: Emergency Medicine | Admitting: Emergency Medicine

## 2024-08-30 ENCOUNTER — Encounter (HOSPITAL_COMMUNITY): Payer: Self-pay

## 2024-08-30 DIAGNOSIS — R0789 Other chest pain: Secondary | ICD-10-CM | POA: Diagnosis present

## 2024-08-30 DIAGNOSIS — R079 Chest pain, unspecified: Secondary | ICD-10-CM

## 2024-08-30 LAB — CBC
HCT: 42.1 % (ref 36.0–46.0)
Hemoglobin: 13.5 g/dL (ref 12.0–15.0)
MCH: 26.4 pg (ref 26.0–34.0)
MCHC: 32.1 g/dL (ref 30.0–36.0)
MCV: 82.4 fL (ref 80.0–100.0)
Platelets: 354 K/uL (ref 150–400)
RBC: 5.11 MIL/uL (ref 3.87–5.11)
RDW: 14.1 % (ref 11.5–15.5)
WBC: 9.5 K/uL (ref 4.0–10.5)
nRBC: 0 % (ref 0.0–0.2)

## 2024-08-30 NOTE — ED Triage Notes (Signed)
 Pt reports L sided chest pain radiating to her neck since this am. No SHOB.

## 2024-08-31 ENCOUNTER — Ambulatory Visit (INDEPENDENT_AMBULATORY_CARE_PROVIDER_SITE_OTHER): Admitting: Physician Assistant

## 2024-08-31 VITALS — BP 134/73 | HR 74 | Ht 65.0 in | Wt 206.0 lb

## 2024-08-31 DIAGNOSIS — Z6834 Body mass index (BMI) 34.0-34.9, adult: Secondary | ICD-10-CM

## 2024-08-31 DIAGNOSIS — Z7689 Persons encountering health services in other specified circumstances: Secondary | ICD-10-CM

## 2024-08-31 DIAGNOSIS — E6609 Other obesity due to excess calories: Secondary | ICD-10-CM

## 2024-08-31 DIAGNOSIS — E66811 Obesity, class 1: Secondary | ICD-10-CM | POA: Diagnosis not present

## 2024-08-31 DIAGNOSIS — R4589 Other symptoms and signs involving emotional state: Secondary | ICD-10-CM

## 2024-08-31 LAB — TROPONIN I (HIGH SENSITIVITY)
Troponin I (High Sensitivity): 5 ng/L (ref <18)
Troponin I (High Sensitivity): 6 ng/L (ref <18)

## 2024-08-31 LAB — BASIC METABOLIC PANEL WITH GFR
Anion gap: 11 (ref 5–15)
BUN: 19 mg/dL (ref 6–20)
CO2: 27 mmol/L (ref 22–32)
Calcium: 8.9 mg/dL (ref 8.9–10.3)
Chloride: 101 mmol/L (ref 98–111)
Creatinine, Ser: 1.26 mg/dL — ABNORMAL HIGH (ref 0.44–1.00)
GFR, Estimated: 50 mL/min — ABNORMAL LOW (ref >60)
Glucose, Bld: 109 mg/dL — ABNORMAL HIGH (ref 70–99)
Potassium: 3.8 mmol/L (ref 3.5–5.1)
Sodium: 139 mmol/L (ref 135–145)

## 2024-08-31 MED ORDER — PANTOPRAZOLE SODIUM 40 MG PO TBEC
40.0000 mg | DELAYED_RELEASE_TABLET | Freq: Every day | ORAL | 3 refills | Status: AC
Start: 2024-08-31 — End: ?

## 2024-08-31 NOTE — Patient Instructions (Signed)
 Tirzepatide Injection (Weight Management) What is this medication? TIRZEPATIDE (tir ZEP a tide) promotes weight loss. It may also be used to maintain weight loss.  It works by decreasing appetite. It can be used to treat sleep apnea. Changes to diet and exercise are often combined with this medication. This medicine may be used for other purposes; ask your health care provider or pharmacist if you have questions. COMMON BRAND NAME(S): Zepbound What should I tell my care team before I take this medication? They need to know if you have any of these conditions: Diabetes Eye disease caused by diabetes Gallbladder disease Have or have had depression Have or have had pancreatitis Having surgery Kidney disease Personal or family history of MEN 2, a condition that causes endocrine gland tumors Personal or family history of thyroid  cancer Stomach or intestine problems, such as problems digesting food Suicidal thoughts, plans, or attempt An unusual or allergic reaction to tirzepatide, other medications, foods, dyes, or preservatives Pregnant or trying to get pregnant Breastfeeding How should I use this medication? This medication is injected under the skin. You will be taught how to prepare and give it. Take it as directed on the prescription label. Keep taking it unless your care team tells you to stop. It is important that you put your used needles and syringes in a special sharps container. Do not put them in a trash can. If you do not have a sharps container, call your pharmacist or care team to get one. A special MedGuide will be given to you by the pharmacist with each prescription and refill. Be sure to read this information carefully each time. This medication comes with INSTRUCTIONS FOR USE. Ask your pharmacist for directions on how to use this medication. Read the information carefully. Talk to your pharmacist or care team if you have questions. Talk to your care team about the use of this  medication in children. Special care may be needed. Overdosage: If you think you have taken too much of this medicine contact a poison control center or emergency room at once. NOTE: This medicine is only for you. Do not share this medicine with others. What if I miss a dose? If you miss a dose, take it as soon as you can unless it is more than 4 days (96 hours) late. If it is more than 4 days late, skip the missed dose. Take the next dose at the normal time. Do not take 2 doses within 3 days (72 hours) of each other. What may interact with this medication? Certain medications for diabetes, such as insulin , glyburide, glipizide This medication may affect how other medications work. Talk with your care team about all of the medications you take. They may suggest changes to your treatment plan to lower the risk of side effects and to make sure your medications work as intended. This list may not describe all possible interactions. Give your health care provider a list of all the medicines, herbs, non-prescription drugs, or dietary supplements you use. Also tell them if you smoke, drink alcohol, or use illegal drugs. Some items may interact with your medicine. What should I watch for while using this medication? Visit your care team for regular checks on your progress. Tell your care team if your condition does not start to get better or if it gets worse. Tell your care team if you are taking medication to treat diabetes, such as insulin  or glipizide. This may increase your risk of low blood sugar. Know the  symptoms of low blood sugar and how to treat it. Talk to your care team about your risk of cancer. You may be more at risk for certain types of cancer if you take this medication. Talk to your care team right away if you have a lump or swelling in your neck, hoarseness that does not go away, trouble swallowing, shortness of breath, or trouble breathing. Make sure you stay hydrated while taking this  medication. Drink water often. Eat fruits and veggies that have a high water content. Drink more water when it is hot or you are active. Talk to your care team right away if you have fever, infection, vomiting, diarrhea, or if you sweat a lot while taking this medication. The loss of too much body fluid may make it dangerous for you to take this medication. If you are going to need surgery or a procedure, tell your care team that you are taking this medication. Estrogen and progestin hormones that you take by mouth may not work as well while you are taking this medication. Switch to a non-oral contraceptive or add a barrier contraceptive for 4 weeks after starting this medication and after each dose increase. Talk to your care team about contraceptive options. They can help you find the option that works for you. Do not take this medication without first talking to your care team if you may be or could become pregnant. Your care team can help you find the option that works for you. Weight loss is not recommended during pregnancy. Talk to your care team if you are breastfeeding. When recommended, this medication may be taken. Its use during breastfeeding has not been well studied. Your care team may suggest other options. What side effects may I notice from receiving this medication? Side effects that you should report to your care team as soon as possible: Allergic reactions--skin rash, itching, hives, swelling of the face, lips, tongue, or throat Change in vision Dehydration--increased thirst, dry mouth, feeling faint or lightheaded, headache, dark yellow or brown urine Fast or irregular heartbeat Gallbladder problems--severe stomach pain, nausea, vomiting, fever Kidney injury--decrease in the amount of urine, swelling of the ankles, hands, or feet Pancreatitis--severe stomach pain that spreads to your back or gets worse after eating or when touched, fever, nausea, vomiting Thoughts of suicide or  self-harm, worsening mood, feelings of depression Thyroid  cancer--new mass or lump in the neck, pain or trouble swallowing, trouble breathing, hoarseness Side effects that usually do not require medical attention (report these to your care team if they continue or are bothersome): Constipation Diarrhea Loss of appetite Nausea Upset stomach This list may not describe all possible side effects. Call your doctor for medical advice about side effects. You may report side effects to FDA at 1-800-FDA-1088. Where should I keep my medication? Keep out of the reach of children and pets. Store in a refrigerator or at room temperature up to 30 degrees C (86 degrees F). Keep it in the original container. Protect from light. Refrigeration (preferred): Store in the refrigerator. Do not freeze. Get rid of any unused medication after the expiration date. Room temperature: This medication may be stored at room temperature for up to 21 days. If it is stored at room temperature, get rid of any unused medication after 21 days or after it expires, whichever is first. To get rid of medications that are no longer needed or have expired: Take the medication to a medication take-back program. Check with your pharmacy or law enforcement  to find a location. If you cannot return the medication, ask your pharmacist or care team how to get rid of this medication safely. NOTE: This sheet is a summary. It may not cover all possible information. If you have questions about this medicine, talk to your doctor, pharmacist, or health care provider.  2025 Elsevier/Gold Standard (2023-12-02 00:00:00)

## 2024-08-31 NOTE — ED Provider Notes (Signed)
 [facility] EMERGENCY DEPARTMENT AT [facility] Provider Note   CSN: [id number] Arrival date & time: 08/30/24  2309     Patient presents with: Chest Pain   Tammy Reed is a 58 y.o. female.   Patient presents to the emergency department for evaluation of chest pain.  Patient reports that she developed central chest pain around 2 PM today.  Pain has been persistent.  She reports that it is mild to moderate and nagging, has not changed at all.  She has not identified any exacerbating factors, reports that there is no relation to exertion.  She is not feeling any shortness of breath.       Prior to Admission medications   Medication Sig Start Date End Date Taking? Authorizing Provider  pantoprazole (PROTONIX) 40 MG tablet Take 1 tablet (40 mg total) by mouth daily. 08/31/24  Yes Adonis Yim, Lonni PARAS, MD  amLODipine  (NORVASC ) 2.5 MG tablet Take 1 tablet (2.5 mg total) by mouth daily. 03/08/24   Breeback, Jade L, PA-C  atorvastatin  (LIPITOR) 40 MG tablet Take 1 tablet (40 mg total) by mouth daily. For cholesterol. 02/12/24   Breeback, Jade L, PA-C  buPROPion  (WELLBUTRIN  XL) 150 MG 24 hr tablet Take 1 tablet by mouth once daily 06/29/24   Breeback, Jade L, PA-C  diclofenac  Sodium (VOLTAREN ) 1 % GEL Apply 4 g topically 4 (four) times daily. To affected joint. 02/11/24   Breeback, Jade L, PA-C  fluticasone  (FLONASE ) 50 MCG/ACT nasal spray Place 2 sprays into both nostrils daily. 11/14/23   Breeback, Jade L, PA-C  linaclotide  (LINZESS ) 145 MCG CAPS capsule Take 1 capsule (145 mcg total) by mouth daily before breakfast. 02/11/24   Breeback, Jade L, PA-C  metroNIDAZOLE  (FLAGYL ) 500 MG tablet Take 1 tablet (500 mg total) by mouth 2 (two) times daily. 08/23/24   Breeback, Jade L, PA-C  potassium chloride  (KLOR-CON  M) 10 MEQ tablet Take 1 tablet (10 mEq total) by mouth daily. 02/16/24   Breeback, Jade L, PA-C  sulfamethoxazole-trimethoprim (BACTRIM DS) 800-160 MG tablet Take 1 tablet by mouth 2  (two) times daily. 08/20/24   Breeback, Jade L, PA-C  tirzepatide  (MOUNJARO ) 10 MG/0.5ML Pen Liposlim.  Tirzepatide /Pyridoxine/Thiamine/L-Carnitine 10mg /mL.  Inject 2.5 mg/25 units subcu weekly for 4 weeks then 5 mg/50 units subcu weekly for 4 weeks then 7.5 mg/75 units subcu weekly for 4 weeks then 10 mg/100 units subcu weekly for 4 weeks then 15 mg/150 units subcu weekly 03/31/24   Breeback, Jade L, PA-C  valsartan -hydrochlorothiazide  (DIOVAN -HCT) 160-25 MG tablet Take 1 tablet by mouth daily. 03/08/24   Breeback, Jade L, PA-C    Allergies: Losartan     Review of Systems  Updated Vital Signs BP (!) 148/92   Pulse 62   Temp 98.3 F (36.8 C) (Oral)   Resp 17   Ht 5' 5 (1.651 m)   Wt 94 kg   SpO2 100%   BMI 34.49 kg/m   Physical Exam Vitals and nursing note reviewed.  Constitutional:      General: She is not in acute distress.    Appearance: She is well-developed.  HENT:     Head: Normocephalic and atraumatic.     Mouth/Throat:     Mouth: Mucous membranes are moist.  Eyes:     General: Vision grossly intact. Gaze aligned appropriately.     Extraocular Movements: Extraocular movements intact.     Conjunctiva/sclera: Conjunctivae normal.  Cardiovascular:     Rate and Rhythm: Normal rate and regular rhythm.  Pulses: Normal pulses.     Heart sounds: Normal heart sounds, S1 normal and S2 normal. No murmur heard.    No friction rub. No gallop.  Pulmonary:     Effort: Pulmonary effort is normal. No respiratory distress.     Breath sounds: Normal breath sounds.  Abdominal:     General: Bowel sounds are normal.     Palpations: Abdomen is soft.     Tenderness: There is no abdominal tenderness. There is no guarding or rebound.     Hernia: No hernia is present.  Musculoskeletal:        General: No swelling.     Cervical back: Full passive range of motion without pain, normal range of motion and neck supple. No spinous process tenderness or muscular tenderness. Normal range of  motion.     Right lower leg: No edema.     Left lower leg: No edema.  Skin:    General: Skin is warm and dry.     Capillary Refill: Capillary refill takes less than 2 seconds.     Findings: No ecchymosis, erythema, rash or wound.  Neurological:     General: No focal deficit present.     Mental Status: She is alert and oriented to person, place, and time.     GCS: GCS eye subscore is 4. GCS verbal subscore is 5. GCS motor subscore is 6.     Cranial Nerves: Cranial nerves 2-12 are intact.     Sensory: Sensation is intact.     Motor: Motor function is intact.     Coordination: Coordination is intact.  Psychiatric:        Attention and Perception: Attention normal.        Mood and Affect: Mood normal.        Speech: Speech normal.        Behavior: Behavior normal.     (all labs ordered are listed, but only abnormal results are displayed) Labs Reviewed  BASIC METABOLIC PANEL WITH GFR - Abnormal; Notable for the following components:      Result Value   Glucose, Bld 109 (*)    Creatinine, Ser 1.26 (*)    GFR, Estimated 50 (*)    All other components within normal limits  CBC  TROPONIN I (HIGH SENSITIVITY)  TROPONIN I (HIGH SENSITIVITY)    EKG: EKG Interpretation Date/Time:  Monday August 30 2024 23:40:51 EDT Ventricular Rate:  61 PR Interval:  168 QRS Duration:  94 QT Interval:  434 QTC Calculation: 436 R Axis:   34  Text Interpretation: Normal sinus rhythm Nonspecific ST abnormality Abnormal ECG When compared with ECG of 22-Jun-2008 18:34, No significant change since last tracing Confirmed by Haze Lonni PARAS [phone]) on 08/31/2024 3:22:21 AM  Radiology: DG Chest 2 View Result Date: 08/30/2024 EXAM: 2 VIEW(S) XRAY OF THE CHEST 08/30/2024 11:54:54 PM COMPARISON: Chest x-ray 08/18/2006. CLINICAL HISTORY: CP. Encounter for chest pain. FINDINGS: LUNGS AND PLEURA: No focal pulmonary opacity. No pulmonary edema. No pleural effusion. No pneumothorax. HEART AND  MEDIASTINUM: No acute abnormality of the cardiac and mediastinal silhouettes. BONES AND SOFT TISSUES: No acute osseous abnormality. IMPRESSION: 1. No acute cardiopulmonary disease. Electronically signed by: Greig Pique MD 08/30/2024 11:59 PM EDT RP Workstation: [id number]     Procedures   Medications Ordered in the ED - No data to display  Medical Decision Making Amount and/or Complexity of Data Reviewed External Data Reviewed: ECG. Labs: ordered. Decision-making details documented in ED Course. Radiology: ordered and independent interpretation performed. Decision-making details documented in ED Course. ECG/medicine tests: ordered and independent interpretation performed. Decision-making details documented in ED Course.   Differential Diagnosis considered includes, but not limited to: STEMI; NSTEMI; myocarditis; pericarditis; pulmonary embolism; aortic dissection; pneumothorax; pneumonia; gastritis; musculoskeletal pain  Patient presents with complaints of chest pain.  She has had continuous pain since 2 PM.  There is no exertional component, nothing changes the symptoms.  She appears well.  EKG without any obvious ischemia or acute changes.  Troponins negative.  She does not have a pulmonary component, no pleuritic pain, shortness of breath, hypoxia, tachycardia.  Doubt PE.  Cardiac silhouette normal on x-ray.  Pain is mild, not migratory, doubt aortic dissection.  Overall, patient appears well and I doubt cardiac etiology.  Patient will be appropriate for discharge with outpatient follow-up.  Given return precautions.     Final diagnoses:  Chest pain, unspecified type    ED Discharge Orders          Ordered    pantoprazole (PROTONIX) 40 MG tablet  Daily        08/31/24 0609               Haze Lonni PARAS, MD 08/31/24 [phone]

## 2024-08-31 NOTE — Progress Notes (Unsigned)
   Established Patient Office Visit  Subjective   Patient ID: Tammy Reed, female    DOB: 11/11/1966  Age: 58 y.o. MRN: [id number]  HPI Pt is a 58 yo obese female who would like to discuss her weight. She is unsure about the tirzepatide  injection. She thinks she has not been giving her self any because the vial is still full. She denies any side effects of nausea, constipation, reflux. She does not feel like losing weight either and has been on it for a month. She wonders if she should continue.   Mood controlled with no problems or side effects. Taking wellbutrin .   ROS See HPI.    Objective:     BP 134/73   Pulse 74   Ht 5' 5 (1.651 m)   Wt 206 lb (93.4 kg)   SpO2 99%   BMI 34.28 kg/m  BP Readings from Last 3 Encounters:  08/31/24 134/73  08/31/24 (!) 148/92  08/20/24 (!) 172/84   Wt Readings from Last 3 Encounters:  08/31/24 206 lb (93.4 kg)  08/30/24 207 lb 3.7 oz (94 kg)  08/20/24 207 lb (93.9 kg)      Physical Exam Constitutional:      Appearance: Normal appearance.  HENT:     Head: Normocephalic.  Cardiovascular:     Rate and Rhythm: Normal rate and regular rhythm.  Pulmonary:     Effort: Pulmonary effort is normal.     Breath sounds: Normal breath sounds.  Musculoskeletal:     Right lower leg: No edema.     Left lower leg: No edema.  Neurological:     General: No focal deficit present.     Mental Status: She is alert and oriented to person, place, and time.  Psychiatric:        Mood and Affect: Mood normal.        The 10-year ASCVD risk score (Arnett DK, et al., 2019) is: 5.5%    Assessment & Plan:  SABRASABRABess was seen today for medical management of chronic issues.  Diagnoses and all orders for this visit:  Encounter for weight management  Class 1 obesity due to excess calories without serious comorbidity with body mass index (BMI) of 34.0 to 34.9 in adult  Depressed mood -     buPROPion  (WELLBUTRIN  XL) 150 MG 24 hr tablet; Take 1 tablet  (150 mg total) by mouth daily.   Continue tirzepatide  at least until your vial is complete to see if any weight loss and if you have side effects Start 25 units and then increase to 50 units weekly if tolerated Can bring in for nurse visit to teach how to give yourself injections Failed phentermine  On wellbutrin  which can help with mood and weight loss Encouraged regular exercise 150 minutes a week Aim for 60-80g of protein a day Follow up in 3 months   Vermell Bologna, PA-C

## 2024-09-06 ENCOUNTER — Encounter: Payer: Self-pay | Admitting: Physician Assistant

## 2024-09-06 DIAGNOSIS — Z7689 Persons encountering health services in other specified circumstances: Secondary | ICD-10-CM | POA: Insufficient documentation

## 2024-09-06 DIAGNOSIS — R4589 Other symptoms and signs involving emotional state: Secondary | ICD-10-CM | POA: Insufficient documentation

## 2024-09-06 MED ORDER — BUPROPION HCL ER (XL) 150 MG PO TB24: 150.0000 mg | ORAL_TABLET | Freq: Every day | ORAL | 3 refills | Status: AC

## 2024-09-28 ENCOUNTER — Telehealth: Payer: Self-pay

## 2024-09-28 DIAGNOSIS — Z1211 Encounter for screening for malignant neoplasm of colon: Secondary | ICD-10-CM

## 2024-09-28 NOTE — Telephone Encounter (Signed)
 Nicosha agreed to do the [employer]. Order placed.

## 2025-03-01 ENCOUNTER — Ambulatory Visit: Admitting: Physician Assistant
